# Patient Record
Sex: Male | Born: 1960
Health system: Southern US, Community
[De-identification: ages and names within clinical notes are randomized; demographics above are authoritative.]

## PROBLEM LIST (undated history)

## (undated) DIAGNOSIS — M549 Dorsalgia, unspecified: Secondary | ICD-10-CM

## (undated) DIAGNOSIS — G8929 Other chronic pain: Secondary | ICD-10-CM

## (undated) DIAGNOSIS — I96 Gangrene, not elsewhere classified: Secondary | ICD-10-CM

## (undated) DIAGNOSIS — IMO0002 Reserved for concepts with insufficient information to code with codable children: Secondary | ICD-10-CM

## (undated) DIAGNOSIS — F4310 Post-traumatic stress disorder, unspecified: Secondary | ICD-10-CM

## (undated) DIAGNOSIS — I10 Essential (primary) hypertension: Secondary | ICD-10-CM

## (undated) HISTORY — PX: ABSCESS DRAINAGE: SHX1119

## (undated) HISTORY — PX: OTHER SURGICAL HISTORY: SHX169

---
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
## 2002-05-05 ENCOUNTER — Emergency Department (HOSPITAL_COMMUNITY): Admission: EM | Admit: 2002-05-05 | Discharge: 2002-05-05 | Payer: Self-pay

## 2002-08-15 ENCOUNTER — Encounter: Payer: Self-pay | Admitting: *Deleted

## 2002-08-15 ENCOUNTER — Emergency Department (HOSPITAL_COMMUNITY): Admission: EM | Admit: 2002-08-15 | Discharge: 2002-08-15 | Payer: Self-pay | Admitting: Emergency Medicine

## 2002-08-18 ENCOUNTER — Emergency Department (HOSPITAL_COMMUNITY): Admission: EM | Admit: 2002-08-18 | Discharge: 2002-08-18 | Payer: Self-pay | Admitting: Emergency Medicine

## 2002-08-18 ENCOUNTER — Encounter: Payer: Self-pay | Admitting: Emergency Medicine

## 2002-08-22 ENCOUNTER — Emergency Department (HOSPITAL_COMMUNITY): Admission: EM | Admit: 2002-08-22 | Discharge: 2002-08-22 | Payer: Self-pay | Admitting: Emergency Medicine

## 2002-09-13 ENCOUNTER — Emergency Department (HOSPITAL_COMMUNITY): Admission: EM | Admit: 2002-09-13 | Discharge: 2002-09-13 | Payer: Self-pay | Admitting: Emergency Medicine

## 2002-09-13 ENCOUNTER — Encounter: Payer: Self-pay | Admitting: Emergency Medicine

## 2007-02-07 ENCOUNTER — Emergency Department (HOSPITAL_COMMUNITY): Admission: EM | Admit: 2007-02-07 | Discharge: 2007-02-07 | Payer: Self-pay | Admitting: Emergency Medicine

## 2008-12-23 ENCOUNTER — Emergency Department (HOSPITAL_COMMUNITY): Admission: EM | Admit: 2008-12-23 | Discharge: 2008-12-23 | Payer: Self-pay | Admitting: Emergency Medicine

## 2008-12-28 ENCOUNTER — Inpatient Hospital Stay (HOSPITAL_COMMUNITY): Admission: EM | Admit: 2008-12-28 | Discharge: 2008-12-30 | Payer: Self-pay | Admitting: Emergency Medicine

## 2009-01-15 ENCOUNTER — Emergency Department (HOSPITAL_COMMUNITY): Admission: EM | Admit: 2009-01-15 | Discharge: 2009-01-15 | Payer: Self-pay | Admitting: Emergency Medicine

## 2010-05-15 ENCOUNTER — Emergency Department (HOSPITAL_COMMUNITY)
Admission: EM | Admit: 2010-05-15 | Discharge: 2010-05-15 | Disposition: A | Discharge status: Home / Self Care | Payer: Medicare Other | Attending: Emergency Medicine | Admitting: Emergency Medicine

## 2010-05-15 DIAGNOSIS — M545 Low back pain, unspecified: Secondary | ICD-10-CM | POA: Insufficient documentation

## 2010-05-15 DIAGNOSIS — M549 Dorsalgia, unspecified: Secondary | ICD-10-CM | POA: Insufficient documentation

## 2010-05-18 LAB — DIFFERENTIAL
Eosinophils Relative: 1 % (ref 0–5)
Lymphocytes Relative: 19 % (ref 12–46)
Lymphs Abs: 2.5 10*3/uL (ref 0.7–4.0)
Monocytes Absolute: 1.4 10*3/uL — ABNORMAL HIGH (ref 0.1–1.0)
Neutro Abs: 9.4 10*3/uL — ABNORMAL HIGH (ref 1.7–7.7)

## 2010-05-18 LAB — CULTURE, ROUTINE-ABSCESS

## 2010-05-18 LAB — POCT I-STAT, CHEM 8
BUN: 8 mg/dL (ref 6–23)
Calcium, Ion: 1.13 mmol/L (ref 1.12–1.32)
Creatinine, Ser: 1.2 mg/dL (ref 0.4–1.5)
Hemoglobin: 17.7 g/dL — ABNORMAL HIGH (ref 13.0–17.0)
Sodium: 141 mEq/L (ref 135–145)
TCO2: 32 mmol/L (ref 0–100)

## 2010-05-18 LAB — ANAEROBIC CULTURE

## 2010-05-18 LAB — CBC
HCT: 48.2 % (ref 39.0–52.0)
Hemoglobin: 16.4 g/dL (ref 13.0–17.0)
RBC: 5.39 MIL/uL (ref 4.22–5.81)
WBC: 13.4 10*3/uL — ABNORMAL HIGH (ref 4.0–10.5)

## 2010-05-18 LAB — CULTURE, BLOOD (ROUTINE X 2)

## 2010-05-18 LAB — APTT: aPTT: 31 seconds (ref 24–37)

## 2010-12-31 ENCOUNTER — Encounter: Payer: Self-pay | Admitting: *Deleted

## 2010-12-31 ENCOUNTER — Emergency Department (INDEPENDENT_AMBULATORY_CARE_PROVIDER_SITE_OTHER)
Admission: EM | Admit: 2010-12-31 | Discharge: 2010-12-31 | Disposition: A | Discharge status: Home / Self Care | Payer: Medicare Other | Source: Home / Self Care | Attending: Family Medicine | Admitting: Family Medicine

## 2010-12-31 DIAGNOSIS — L03211 Cellulitis of face: Secondary | ICD-10-CM

## 2010-12-31 DIAGNOSIS — J019 Acute sinusitis, unspecified: Secondary | ICD-10-CM

## 2010-12-31 DIAGNOSIS — L0201 Cutaneous abscess of face: Secondary | ICD-10-CM

## 2010-12-31 MED ORDER — SULFAMETHOXAZOLE-TRIMETHOPRIM 800-160 MG PO TABS: 1.0000 | ORAL_TABLET | Freq: Two times a day (BID) | ORAL | Status: AC

## 2010-12-31 MED ORDER — MUPIROCIN CALCIUM 2 % EX CREA: TOPICAL_CREAM | Freq: Three times a day (TID) | CUTANEOUS | Status: AC

## 2010-12-31 MED ORDER — FEXOFENADINE-PSEUDOEPHED ER 180-240 MG PO TB24: 1.0000 | ORAL_TABLET | Freq: Every day | ORAL | Status: DC

## 2010-12-31 NOTE — ED Provider Notes (Signed)
History     CSN: [id number] Arrival date & time: 12/31/2010 10:53 AM   First MD Initiated Contact with Patient 12/31/10 1147      Chief Complaint  Patient presents with  . Cyst    (Consider location/radiation/quality/duration/timing/severity/associated sxs/prior treatment) Patient is a 50 y.o. male presenting with abscess. The history is provided by the patient.  Abscess  This is a new problem. The current episode started more than one week ago. The problem occurs continuously. The problem has been gradually worsening. The abscess is present on the face. The problem is moderate. The abscess is characterized by redness, painfulness, draining and swelling. Associated symptoms include congestion. There is nasal congestion. The congestion does not interfere with sleep. The congestion does not interfere with eating or drinking. The rhinorrhea has been occurring frequently. The nasal discharge has a yellow and green appearance. There were no sick contacts. He has received no recent medical care.   Patient reports multiple times opening the abscess. History reviewed. No pertinent past medical history.  Past Surgical History  Procedure Date  . Right thumb, finger     History reviewed. No pertinent family history.  History  Substance Use Topics  . Smoking status: Current Everyday Smoker  . Smokeless tobacco: Not on file  . Alcohol Use: Yes      Review of Systems  HENT: Positive for congestion.        Pressure over R nostril     Allergies  Review of patient's allergies indicates no known allergies.  Home Medications   Current Outpatient Rx  Name Route Sig Dispense Refill  . FEXOFENADINE-PSEUDOEPHEDRINE 180-240 MG PO TB24 Oral Take 1 tablet by mouth daily. 30 tablet 1  . MUPIROCIN CALCIUM 2 % EX CREA Topical Apply topically 3 (three) times daily. 15 g 0  . SULFAMETHOXAZOLE-TRIMETHOPRIM 800-160 MG PO TABS Oral Take 1 tablet by mouth 2 (two) times daily. 28 tablet 0    BP  144/92  Pulse 81  Temp(Src) 99.1 F (37.3 C) (Oral)  Resp 18  SpO2 98%  Physical Exam  Constitutional: He appears well-developed and well-nourished.  HENT:  Head: Normocephalic.    Right Ear: Hearing, tympanic membrane, external ear and ear canal normal.  Left Ear: Hearing, tympanic membrane, external ear and ear canal normal.  Nose: Mucosal edema, rhinorrhea and sinus tenderness present. Right sinus exhibits maxillary sinus tenderness.  Mouth/Throat: Mucous membranes are normal.       Multiple old drainage sites    ED Course  Procedures (including critical care time) Patient informed abscess is not ready to open because of its repeatedly drainage by him  1. Acute sinusitis   2. Abscess of face       MDM          Hassan Rowan, MD 12/31/10 2040

## 2010-12-31 NOTE — ED Notes (Signed)
Co cyst to right cheek area x 1 week, getting worse past few days

## 2011-03-24 ENCOUNTER — Emergency Department (HOSPITAL_COMMUNITY)
Admission: EM | Admit: 2011-03-24 | Discharge: 2011-03-24 | Disposition: A | Discharge status: Home / Self Care | Payer: Medicare Other | Attending: Emergency Medicine | Admitting: Emergency Medicine

## 2011-03-24 ENCOUNTER — Encounter (HOSPITAL_COMMUNITY): Payer: Self-pay

## 2011-03-24 DIAGNOSIS — M25571 Pain in right ankle and joints of right foot: Secondary | ICD-10-CM

## 2011-03-24 DIAGNOSIS — M19079 Primary osteoarthritis, unspecified ankle and foot: Secondary | ICD-10-CM

## 2011-03-24 DIAGNOSIS — M25579 Pain in unspecified ankle and joints of unspecified foot: Secondary | ICD-10-CM | POA: Insufficient documentation

## 2011-03-24 MED ORDER — TRAMADOL HCL 50 MG PO TABS: 50.0000 mg | ORAL_TABLET | Freq: Four times a day (QID) | ORAL | Status: AC | PRN

## 2011-03-24 NOTE — ED Provider Notes (Signed)
Medical screening examination/treatment/procedure(s) were performed by non-physician practitioner and as supervising physician I was immediately available for consultation/collaboration.  Flint Melter, MD 03/24/11 2149
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
## 2011-03-24 NOTE — ED Notes (Signed)
Rt, ankle pain denies any injury, no swelling or deformity noted

## 2011-07-05 ENCOUNTER — Emergency Department (HOSPITAL_COMMUNITY)
Admission: EM | Admit: 2011-07-05 | Discharge: 2011-07-05 | Disposition: A | Discharge status: Home / Self Care | Payer: Medicare Other | Attending: Emergency Medicine | Admitting: Emergency Medicine

## 2011-07-05 ENCOUNTER — Encounter (HOSPITAL_COMMUNITY): Payer: Self-pay | Admitting: Emergency Medicine

## 2011-07-05 DIAGNOSIS — IMO0002 Reserved for concepts with insufficient information to code with codable children: Secondary | ICD-10-CM | POA: Insufficient documentation

## 2011-07-05 DIAGNOSIS — F17200 Nicotine dependence, unspecified, uncomplicated: Secondary | ICD-10-CM | POA: Insufficient documentation

## 2011-07-05 DIAGNOSIS — L02419 Cutaneous abscess of limb, unspecified: Secondary | ICD-10-CM

## 2011-07-05 HISTORY — DX: Gangrene, not elsewhere classified: I96

## 2011-07-05 MED ORDER — CLINDAMYCIN HCL 300 MG PO CAPS
300.0000 mg | ORAL_CAPSULE | Freq: Once | ORAL | Status: AC
  Administered 2011-07-05: 300 mg via ORAL
  Filled 2011-07-05: qty 1

## 2011-07-05 MED ORDER — SULFAMETHOXAZOLE-TMP DS 800-160 MG PO TABS
1.0000 | ORAL_TABLET | Freq: Once | ORAL | Status: AC
  Administered 2011-07-05: 1 via ORAL
  Filled 2011-07-05: qty 1

## 2011-07-05 MED ORDER — CLINDAMYCIN HCL 150 MG PO CAPS: 150.0000 mg | ORAL_CAPSULE | Freq: Four times a day (QID) | ORAL | Status: AC

## 2011-07-05 MED ORDER — LIDOCAINE HCL 1 % IJ SOLN
2.0000 mL | Freq: Once | INTRAMUSCULAR | Status: AC
  Administered 2011-07-05: 2 mL

## 2011-07-05 MED ORDER — HYDROCODONE-ACETAMINOPHEN 5-325 MG PO TABS
1.0000 | ORAL_TABLET | Freq: Once | ORAL | Status: AC
  Administered 2011-07-05: 1 via ORAL
  Filled 2011-07-05: qty 1

## 2011-07-05 MED ORDER — SULFAMETHOXAZOLE-TMP DS 800-160 MG PO TABS: 1.0000 | ORAL_TABLET | Freq: Two times a day (BID) | ORAL | Status: AC

## 2011-07-05 MED ORDER — HYDROCODONE-ACETAMINOPHEN 5-325 MG PO TABS: 1.0000 | ORAL_TABLET | Freq: Four times a day (QID) | ORAL | Status: AC | PRN
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
## 2011-07-05 NOTE — ED Provider Notes (Signed)
Medical screening examination/treatment/procedure(s) were performed by non-physician practitioner and as supervising physician I was immediately available for consultation/collaboration.  Jaidalyn Schillo M Kamy Poinsett, MD 07/05/11 0714 

## 2011-07-05 NOTE — Discharge Instructions (Signed)
Abscess Care After An abscess (also called a boil or furuncle) is an infected area that contains a collection of pus. Signs and symptoms of an abscess include pain, tenderness, redness, or hardness, or you may feel a moveable soft area under your skin. An abscess can occur anywhere in the body. The infection may spread to surrounding tissues causing cellulitis. A cut (incision) by the surgeon was made over your abscess and the pus was drained out. Gauze may have been packed into the space to provide a drain that will allow the cavity to heal from the inside outwards. The boil may be painful for 5 to 7 days. Most people with a boil do not have high fevers. Your abscess, if seen early, may not have localized, and may not have been lanced. If not, another appointment may be required for this if it does not get better on its own or with medications. HOME CARE INSTRUCTIONS   Only take over-the-counter or prescription medicines for pain, discomfort, or fever as directed by your caregiver.   When you bathe, soak and then remove gauze or iodoform packs at least daily or as directed by your caregiver. You may then wash the wound gently with mild soapy water. Repack with gauze or do as your caregiver directs.  SEEK IMMEDIATE MEDICAL CARE IF:   You develop increased pain, swelling, redness, drainage, or bleeding in the wound site.   You develop signs of generalized infection including muscle aches, chills, fever, or a general ill feeling.   An oral temperature above 102 F (38.9 C) develops, not controlled by medication.  See your caregiver for a recheck if you develop any of the symptoms described above. If medications (antibiotics) were prescribed, take them as directed. Document Released: 08/18/2004 Document Revised: 01/19/2011 Document Reviewed: 04/15/2007 [facility] Patient Information 2012 [place], [place]. Take all the antibiotic until completion.  He can remove the packing in 2 days.  He will have  continued drainage until the skin has closed over.  Please keep the dressing in place until this occurs up with your doctors at the [place]

## 2011-07-05 NOTE — ED Notes (Signed)
Presents to ED with c/o "painful cysts under my left arm".  Noted 4 intact raised and swollen areas on left axilla. Pt reports that about a week ago, he popped out one abscess-- able to "squeezed pus out of it", pt further reports that after draining the abscessed area, 3 more abscesses came up and now a total of 4--verbalized that "it got infected and spreading". Pt denies having any fever or any other symptoms.

## 2011-07-23 ENCOUNTER — Encounter (HOSPITAL_COMMUNITY): Payer: Self-pay | Admitting: *Deleted

## 2011-07-23 ENCOUNTER — Emergency Department (HOSPITAL_COMMUNITY): Payer: Medicare Other

## 2011-07-23 ENCOUNTER — Emergency Department (HOSPITAL_COMMUNITY)
Admission: EM | Admit: 2011-07-23 | Discharge: 2011-07-23 | Payer: Medicare Other | Attending: Emergency Medicine | Admitting: Emergency Medicine

## 2011-07-23 ENCOUNTER — Encounter (HOSPITAL_COMMUNITY): Payer: Self-pay

## 2011-07-23 ENCOUNTER — Emergency Department (HOSPITAL_COMMUNITY)
Admission: EM | Admit: 2011-07-23 | Discharge: 2011-07-23 | Disposition: A | Discharge status: Home / Self Care | Payer: Medicare Other | Attending: Emergency Medicine | Admitting: Emergency Medicine

## 2011-07-23 DIAGNOSIS — IMO0002 Reserved for concepts with insufficient information to code with codable children: Secondary | ICD-10-CM | POA: Insufficient documentation

## 2011-07-23 DIAGNOSIS — T148XXA Other injury of unspecified body region, initial encounter: Secondary | ICD-10-CM

## 2011-07-23 DIAGNOSIS — R079 Chest pain, unspecified: Secondary | ICD-10-CM | POA: Insufficient documentation

## 2011-07-23 DIAGNOSIS — F329 Major depressive disorder, single episode, unspecified: Secondary | ICD-10-CM | POA: Insufficient documentation

## 2011-07-23 DIAGNOSIS — F4310 Post-traumatic stress disorder, unspecified: Secondary | ICD-10-CM | POA: Insufficient documentation

## 2011-07-23 DIAGNOSIS — M549 Dorsalgia, unspecified: Secondary | ICD-10-CM | POA: Insufficient documentation

## 2011-07-23 DIAGNOSIS — G8929 Other chronic pain: Secondary | ICD-10-CM | POA: Insufficient documentation

## 2011-07-23 DIAGNOSIS — F3289 Other specified depressive episodes: Secondary | ICD-10-CM | POA: Insufficient documentation

## 2011-07-23 HISTORY — DX: Post-traumatic stress disorder, unspecified: F43.10

## 2011-07-23 HISTORY — DX: Reserved for concepts with insufficient information to code with codable children: IMO0002

## 2011-07-23 HISTORY — DX: Essential (primary) hypertension: I10
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
## 2011-07-23 NOTE — ED Notes (Signed)
Patient given discharge instructions, information, prescriptions, and diet order. Patient states that they adequately understand discharge information given and to return to ED if symptoms return or worsen.     
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
## 2011-07-24 NOTE — ED Provider Notes (Signed)
Medical screening examination/treatment/procedure(s) were performed by non-physician practitioner and as supervising physician I was immediately available for consultation/collaboration.  Renai Lopata, MD 07/24/11 0030 

## 2012-03-21 ENCOUNTER — Encounter (HOSPITAL_COMMUNITY): Payer: Self-pay | Admitting: *Deleted

## 2012-03-21 ENCOUNTER — Emergency Department (HOSPITAL_COMMUNITY)
Admission: EM | Admit: 2012-03-21 | Discharge: 2012-03-21 | Disposition: A | Discharge status: Home / Self Care | Payer: No Typology Code available for payment source | Attending: Emergency Medicine | Admitting: Emergency Medicine

## 2012-03-21 ENCOUNTER — Emergency Department (HOSPITAL_COMMUNITY): Payer: No Typology Code available for payment source

## 2012-03-21 DIAGNOSIS — R0789 Other chest pain: Secondary | ICD-10-CM

## 2012-03-21 DIAGNOSIS — R071 Chest pain on breathing: Secondary | ICD-10-CM | POA: Insufficient documentation

## 2012-03-21 DIAGNOSIS — Z8659 Personal history of other mental and behavioral disorders: Secondary | ICD-10-CM | POA: Insufficient documentation

## 2012-03-21 DIAGNOSIS — Z79899 Other long term (current) drug therapy: Secondary | ICD-10-CM | POA: Insufficient documentation

## 2012-03-21 DIAGNOSIS — I10 Essential (primary) hypertension: Secondary | ICD-10-CM | POA: Insufficient documentation

## 2012-03-21 DIAGNOSIS — Z8739 Personal history of other diseases of the musculoskeletal system and connective tissue: Secondary | ICD-10-CM | POA: Insufficient documentation

## 2012-03-21 DIAGNOSIS — Z8619 Personal history of other infectious and parasitic diseases: Secondary | ICD-10-CM | POA: Insufficient documentation

## 2012-03-21 DIAGNOSIS — F17200 Nicotine dependence, unspecified, uncomplicated: Secondary | ICD-10-CM | POA: Insufficient documentation

## 2012-03-21 DIAGNOSIS — Y939 Activity, unspecified: Secondary | ICD-10-CM | POA: Insufficient documentation

## 2012-03-21 MED ORDER — OXYCODONE-ACETAMINOPHEN 5-325 MG PO TABS
1.0000 | ORAL_TABLET | Freq: Once | ORAL | Status: AC
  Administered 2012-03-21: 1 via ORAL
  Filled 2012-03-21: qty 1

## 2012-03-21 MED ORDER — OXYCODONE-ACETAMINOPHEN 5-325 MG PO TABS: 1.0000 | ORAL_TABLET | Freq: Four times a day (QID) | ORAL | Status: DC | PRN
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
## 2012-03-21 NOTE — ED Notes (Signed)
Patient transported to X-ray 

## 2012-03-21 NOTE — ED Notes (Signed)
Pt c/o of pain with movement in center of chest and SOB at times.

## 2012-04-02 ENCOUNTER — Emergency Department (HOSPITAL_COMMUNITY)
Admission: EM | Admit: 2012-04-02 | Discharge: 2012-04-02 | Disposition: A | Discharge status: Home / Self Care | Payer: Medicare Other | Attending: Emergency Medicine | Admitting: Emergency Medicine

## 2012-04-02 ENCOUNTER — Encounter (HOSPITAL_COMMUNITY): Payer: Self-pay | Admitting: Emergency Medicine

## 2012-04-02 DIAGNOSIS — F4310 Post-traumatic stress disorder, unspecified: Secondary | ICD-10-CM | POA: Insufficient documentation

## 2012-04-02 DIAGNOSIS — Z79899 Other long term (current) drug therapy: Secondary | ICD-10-CM | POA: Insufficient documentation

## 2012-04-02 DIAGNOSIS — IMO0002 Reserved for concepts with insufficient information to code with codable children: Secondary | ICD-10-CM | POA: Insufficient documentation

## 2012-04-02 DIAGNOSIS — Z8659 Personal history of other mental and behavioral disorders: Secondary | ICD-10-CM | POA: Insufficient documentation

## 2012-04-02 DIAGNOSIS — Z872 Personal history of diseases of the skin and subcutaneous tissue: Secondary | ICD-10-CM | POA: Insufficient documentation

## 2012-04-02 DIAGNOSIS — Z8739 Personal history of other diseases of the musculoskeletal system and connective tissue: Secondary | ICD-10-CM | POA: Insufficient documentation

## 2012-04-02 DIAGNOSIS — I10 Essential (primary) hypertension: Secondary | ICD-10-CM | POA: Insufficient documentation

## 2012-04-02 DIAGNOSIS — F17200 Nicotine dependence, unspecified, uncomplicated: Secondary | ICD-10-CM | POA: Insufficient documentation

## 2012-04-02 DIAGNOSIS — R451 Restlessness and agitation: Secondary | ICD-10-CM

## 2012-04-02 LAB — CBC
Hemoglobin: 13.7 g/dL (ref 13.0–17.0)
RBC: 4.75 MIL/uL (ref 4.22–5.81)

## 2012-04-02 LAB — RAPID URINE DRUG SCREEN, HOSP PERFORMED
Barbiturates: NOT DETECTED
Cocaine: NOT DETECTED
Tetrahydrocannabinol: POSITIVE — AB

## 2012-04-02 LAB — ACETAMINOPHEN LEVEL: Acetaminophen (Tylenol), Serum: <15 ug/mL (ref 10–30)

## 2012-04-02 LAB — COMPREHENSIVE METABOLIC PANEL
ALT: 16 U/L (ref 0–53)
Alkaline Phosphatase: 73 U/L (ref 39–117)
CO2: 23 mEq/L (ref 19–32)
Chloride: 107 mEq/L (ref 96–112)
GFR calc Af Amer: 86 mL/min — ABNORMAL LOW (ref >90)
GFR calc non Af Amer: 74 mL/min — ABNORMAL LOW (ref >90)
Glucose, Bld: 101 mg/dL — ABNORMAL HIGH (ref 70–99)
Potassium: 3.9 mEq/L (ref 3.5–5.1)
Sodium: 142 mEq/L (ref 135–145)
Total Bilirubin: 0.6 mg/dL (ref 0.3–1.2)

## 2012-04-02 MED ORDER — METHOCARBAMOL 500 MG PO TABS
750.0000 mg | ORAL_TABLET | Freq: Once | ORAL | Status: AC
  Administered 2012-04-02: 750 mg via ORAL
  Filled 2012-04-02: qty 2
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
## 2012-04-02 NOTE — ED Notes (Signed)
Patient changed into blue scrubs and red socks. Two bags of belongings placed behind triage nursing station. One bag has a gold watch and a small cup with one necklace, one ring, and one earring.

## 2012-04-02 NOTE — ED Notes (Signed)
telepsych papers faxed to [facility]. Awaiting confirmation.

## 2012-04-02 NOTE — ED Notes (Signed)
Patient anxious about being in the ED all day. Explained to patient that he is waiting on telepsych. Patient changed into blue scrubs. Patient and belongings bags x 3 wanded by security

## 2012-04-02 NOTE — ED Notes (Signed)
PA Nicole at bedside. 
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
## 2012-04-02 NOTE — ED Notes (Signed)
Consult for telepsych form faxed.  Telepsych specialist on call contacted.

## 2012-04-02 NOTE — ED Notes (Signed)
Pt is talking with Det to set up an appt with Det.

## 2012-04-02 NOTE — ED Notes (Signed)
Off. Earlene Plater talked with Det and has informed pt that the Det would not be coming to the hospital to see him.

## 2012-04-02 NOTE — ED Notes (Signed)
Telepsych physician-Dr. Berlin Hun called and gave a telephone order to discharge the patient.
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
## 2012-04-02 NOTE — ED Provider Notes (Signed)
Medical screening examination/treatment/procedure(s) were performed by non-physician practitioner and as supervising physician I was immediately available for consultation/collaboration.   Celene Kras, MD 04/02/12 [phone]

## 2013-01-26 ENCOUNTER — Emergency Department (HOSPITAL_COMMUNITY)
Admission: EM | Admit: 2013-01-26 | Discharge: 2013-01-26 | Disposition: A | Discharge status: Home / Self Care | Payer: Medicare Other | Attending: Emergency Medicine | Admitting: Emergency Medicine

## 2013-01-26 ENCOUNTER — Encounter (HOSPITAL_COMMUNITY): Payer: Self-pay | Admitting: Emergency Medicine

## 2013-01-26 DIAGNOSIS — Z8659 Personal history of other mental and behavioral disorders: Secondary | ICD-10-CM | POA: Insufficient documentation

## 2013-01-26 DIAGNOSIS — R52 Pain, unspecified: Secondary | ICD-10-CM | POA: Insufficient documentation

## 2013-01-26 DIAGNOSIS — M545 Low back pain, unspecified: Secondary | ICD-10-CM | POA: Insufficient documentation

## 2013-01-26 DIAGNOSIS — F17200 Nicotine dependence, unspecified, uncomplicated: Secondary | ICD-10-CM | POA: Insufficient documentation

## 2013-01-26 DIAGNOSIS — G8929 Other chronic pain: Secondary | ICD-10-CM

## 2013-01-26 DIAGNOSIS — I10 Essential (primary) hypertension: Secondary | ICD-10-CM | POA: Insufficient documentation

## 2013-01-26 DIAGNOSIS — Z79899 Other long term (current) drug therapy: Secondary | ICD-10-CM | POA: Insufficient documentation

## 2013-01-26 MED ORDER — MELOXICAM 15 MG PO TABS
15.0000 mg | ORAL_TABLET | Freq: Every day | ORAL | Status: DC
  Administered 2013-01-26: 15 mg via ORAL
  Filled 2013-01-26: qty 1

## 2013-01-26 MED ORDER — MELOXICAM 15 MG PO TABS: 15.0000 mg | ORAL_TABLET | Freq: Every day | ORAL | Status: DC

## 2013-01-26 MED ORDER — DIAZEPAM 5 MG PO TABS: 5.0000 mg | ORAL_TABLET | Freq: Two times a day (BID) | ORAL | Status: DC

## 2013-01-26 MED ORDER — DIAZEPAM 5 MG PO TABS
5.0000 mg | ORAL_TABLET | Freq: Once | ORAL | Status: AC
  Administered 2013-01-26: 5 mg via ORAL
  Filled 2013-01-26: qty 1

## 2013-01-26 NOTE — ED Provider Notes (Signed)
CSN: [id number]     Arrival date & time 01/26/13  1040 History   First MD Initiated Contact with Patient 01/26/13 1102     Chief Complaint  Patient presents with  . Back Pain   (Consider location/radiation/quality/duration/timing/severity/associated sxs/prior Treatment) HPI  52 year old male hx of DDD presents for low back pain. Patient reportedly has history of chronic back pain however for the past 2 days it has worsened.  sts pain started shortly he returned from a trip from [place]. Denies any heavy lifting or any significant trauma or injury during the trip. Describe pain as a sharp and aching sensation to his right low back occasionally radiates to his right thigh. Pain worsening with movement especially walking. Pain is currently 10 out of 10. Pain is similar to his regular pain except worse.  He has tried tylenol, robaxin, lidocaine cream with some relief.  No complaints of fever, chills, urinary or bowel incontinence, or saddle paresthesia, or rash. No history of IV drug use cancer. Denies any dysuria, hematuria, abdominal pain or testicular pain. Able to ambulate.  Past Medical History  Diagnosis Date  . Gangrene of digit 1975    right thumb gangrene  . Hypertension   . Degenerated intervertebral disc   . PTSD (post-traumatic stress disorder)    Past Surgical History  Procedure Laterality Date  . Right thumb, finger    . Abscess drainage     No family history on file. History  Substance Use Topics  . Smoking status: Current Some Day Smoker  . Smokeless tobacco: Not on file  . Alcohol Use: No     Comment: occasionally    Review of Systems  Constitutional: Negative for fever.  Musculoskeletal: Positive for back pain.  Skin: Negative for rash and wound.  Neurological: Negative for numbness.    Allergies  Review of patient's allergies indicates no known allergies.  Home Medications   Current Outpatient Rx  Name  Route  Sig  Dispense  Refill  .  acetaminophen (TYLENOL) 500 MG tablet   Oral   Take 1,000 mg by mouth every 6 (six) hours as needed.         . lidocaine (LMX) 4 % cream   Topical   Apply 1 application topically 2 (two) times daily as needed (pain).         . methocarbamol (ROBAXIN) 500 MG tablet   Oral   Take 500 mg by mouth 2 (two) times daily.          BP 123/85  Pulse 79  Temp(Src) 98.6 F (37 C) (Oral)  Resp 18  SpO2 100% Physical Exam  Constitutional: He appears well-developed and well-nourished. No distress.  HENT:  Head: Atraumatic.  Eyes: Conjunctivae are normal.  Neck: Normal range of motion. Neck supple.  Abdominal: There is no tenderness.  Genitourinary:  No CVA tenderness.  Musculoskeletal: He exhibits tenderness (Right paralumbar tenderness on palpation without any significant midline spine tenderness, crepitus, or step-off noted. Pain with right hip flexion but negative straight leg raise.).  Neurological: He is alert.  Patellar deep tendon reflex intact bilaterally, no foot drop, able to ambulate.  Skin: No rash noted.  Psychiatric: He has a normal mood and affect.    ED Course  Procedures (including critical care time)  No red flag s/s of low back pain. Patient was counseled on back pain precautions and told to do activity as tolerated but do not lift, push, or pull heavy objects more than 10 pounds  for the next week. Patient counseled to use ice or heat on back for no longer than 15 minutes every hour.   Patient prescribed muscle relaxer and counseled on proper use of muscle relaxant medication.  Patient prescribed mobic and RICE therapy  Patient urged to follow-up with PCP if pain does not improve with treatment and rest or if pain becomes recurrent. Urged to return with worsening severe pain, loss of bowel or bladder control, trouble walking.  The patient verbalizes understanding and agrees with the plan.   Labs Review Labs Reviewed - No data to display Imaging Review No  results found.  EKG Interpretation   None       MDM   1. Acute exacerbation of chronic low back pain    BP 123/85  Pulse 79  Temp(Src) 98.6 F (37 C) (Oral)  Resp 18  SpO2 100%     Fayrene Helper, PA-C 01/26/13 1222

## 2013-01-26 NOTE — ED Notes (Signed)
Pt states that he has hx of back pain.  Worsening over the past 2 days during a trip to [facility].  Denies recent injury.

## 2013-01-28 ENCOUNTER — Emergency Department (HOSPITAL_COMMUNITY): Payer: Medicare Other

## 2013-01-28 ENCOUNTER — Emergency Department (HOSPITAL_COMMUNITY)
Admission: EM | Admit: 2013-01-28 | Discharge: 2013-01-28 | Disposition: A | Discharge status: Home / Self Care | Payer: Medicare Other | Attending: Emergency Medicine | Admitting: Emergency Medicine

## 2013-01-28 ENCOUNTER — Encounter (HOSPITAL_COMMUNITY): Payer: Self-pay | Admitting: Emergency Medicine

## 2013-01-28 DIAGNOSIS — Z791 Long term (current) use of non-steroidal anti-inflammatories (NSAID): Secondary | ICD-10-CM | POA: Insufficient documentation

## 2013-01-28 DIAGNOSIS — F4310 Post-traumatic stress disorder, unspecified: Secondary | ICD-10-CM | POA: Insufficient documentation

## 2013-01-28 DIAGNOSIS — G8929 Other chronic pain: Secondary | ICD-10-CM | POA: Insufficient documentation

## 2013-01-28 DIAGNOSIS — F17200 Nicotine dependence, unspecified, uncomplicated: Secondary | ICD-10-CM | POA: Insufficient documentation

## 2013-01-28 DIAGNOSIS — I10 Essential (primary) hypertension: Secondary | ICD-10-CM | POA: Insufficient documentation

## 2013-01-28 DIAGNOSIS — M545 Low back pain, unspecified: Secondary | ICD-10-CM | POA: Insufficient documentation

## 2013-01-28 DIAGNOSIS — R209 Unspecified disturbances of skin sensation: Secondary | ICD-10-CM | POA: Insufficient documentation

## 2013-01-28 DIAGNOSIS — M549 Dorsalgia, unspecified: Secondary | ICD-10-CM

## 2013-01-28 DIAGNOSIS — Z79899 Other long term (current) drug therapy: Secondary | ICD-10-CM | POA: Insufficient documentation

## 2013-01-28 MED ORDER — DIAZEPAM 5 MG PO TABS
5.0000 mg | ORAL_TABLET | Freq: Two times a day (BID) | ORAL | Status: DC
Start: 2013-01-28 — End: 2014-01-02

## 2013-01-28 MED ORDER — HYDROCODONE-ACETAMINOPHEN 5-325 MG PO TABS: 2.0000 | ORAL_TABLET | ORAL | Status: DC | PRN

## 2013-01-28 MED ORDER — KETOROLAC TROMETHAMINE 60 MG/2ML IM SOLN
60.0000 mg | Freq: Once | INTRAMUSCULAR | Status: AC
  Administered 2013-01-28: 60 mg via INTRAMUSCULAR
  Filled 2013-01-28: qty 2

## 2013-01-28 MED ORDER — DIAZEPAM 5 MG PO TABS
5.0000 mg | ORAL_TABLET | Freq: Once | ORAL | Status: AC
  Administered 2013-01-28: 5 mg via ORAL
  Filled 2013-01-28: qty 1

## 2013-01-28 MED ORDER — DIAZEPAM 5 MG PO TABS: 5.0000 mg | ORAL_TABLET | Freq: Two times a day (BID) | ORAL | Status: DC

## 2013-01-28 NOTE — ED Notes (Signed)
Per [facility]: Pt reports right sided lower back pain that radiates down the right leg. Pt denies injury or trauma. Pt was seen on 01/26/13 for same. Pt states he has a history of back spasms. Pt is ambulatory, vital signs WDL, and pt is in NAD.

## 2013-01-28 NOTE — ED Provider Notes (Signed)
CSN: [id number]     Arrival date & time 01/28/13  1304 History  This chart was scribed for non-physician practitioner Irish Elders, NP working with Toy Baker, MD by Danella Maiers, ED Scribe. This patient was seen in room WTR5/WTR5 and the patient's care was started at 3:12 PM.   Chief Complaint  Patient presents with  . Back Pain   The history is provided by the patient. No language interpreter was used.   HPI Comments: Nathaniel Griffin is a 52 y.o. male with a h/o degenerated intervertebral disc and chronic back pain brought in by ambulance who presents to the Emergency Department complaining of exacerbation of chronic right lower back pain into the top of his right buttock that radiates down the right leg. Pt was seen 2 days ago for the same. He reports intermittent episodes of numbness in his right thigh two days ago. He reports his back pain flares up every 4 months, the last episode was in August. He states normally he walks it off and takes pain medication but this time he is not getting relief. Pt denies injury or trauma. He denies urinary or bowel incontinence. He sees Dr Ophelia Charter in Orthopaedics next week.    Past Medical History  Diagnosis Date  . Gangrene of digit 1975    right thumb gangrene  . Hypertension   . Degenerated intervertebral disc   . PTSD (post-traumatic stress disorder)    Past Surgical History  Procedure Laterality Date  . Right thumb, finger    . Abscess drainage     No family history on file. History  Substance Use Topics  . Smoking status: Current Some Day Smoker  . Smokeless tobacco: Not on file  . Alcohol Use: No     Comment: occasionally    Review of Systems  Musculoskeletal: Positive for back pain.  Neurological: Positive for numbness.  All other systems reviewed and are negative.    Allergies  Review of patient's allergies indicates no known allergies.  Home Medications   Current Outpatient Rx  Name  Route  Sig  Dispense  Refill   . acetaminophen (TYLENOL) 500 MG tablet   Oral   Take 1,000 mg by mouth every 6 (six) hours as needed.         . diazepam (VALIUM) 5 MG tablet   Oral   Take 1 tablet (5 mg total) by mouth 2 (two) times daily.   10 tablet   0   . lidocaine (LMX) 4 % cream   Topical   Apply 1 application topically 2 (two) times daily as needed (pain).         . meloxicam (MOBIC) 15 MG tablet   Oral   Take 1 tablet (15 mg total) by mouth daily.   20 tablet   0   . methocarbamol (ROBAXIN) 500 MG tablet   Oral   Take 500 mg by mouth 2 (two) times daily.          BP 134/86  Pulse 90  Temp(Src) 97.8 F (36.6 C) (Oral)  Resp 14  SpO2 97% Physical Exam  Nursing note and vitals reviewed. Constitutional: He is oriented to person, place, and time. He appears well-developed and well-nourished. No distress.  HENT:  Head: Normocephalic and atraumatic.  Eyes: EOM are normal.  Neck: Neck supple. No tracheal deviation present.  Cardiovascular: Normal rate.   Pulmonary/Chest: Effort normal. No respiratory distress.  Musculoskeletal: Normal range of motion.  paravertebral tenderness on the right  extending down into his right buttock  Neurological: He is alert and oriented to person, place, and time.  Skin: Skin is warm and dry.  Psychiatric: He has a normal mood and affect. His behavior is normal.    ED Course  Procedures (including critical care time) Medications - No data to display  DIAGNOSTIC STUDIES: Oxygen Saturation is 97% on RA, normal by my interpretation.    COORDINATION OF CARE: 3:31 PM- Discussed treatment plan with pt which includes l-spine x-ray, Toradol, and Valium. Pt agrees to plan.  Labs Review Labs Reviewed - No data to display Imaging Review Dg Lumbar Spine Complete  01/28/2013   CLINICAL DATA:  Right-sided lower back pain.  EXAM: LUMBAR SPINE - COMPLETE 4+ VIEW  COMPARISON:  None.  FINDINGS: There are 5 nonrib bearing lumbar-type vertebral bodies. The vertebral  body heights are maintained. There is no spondylolysis. There is no acute fracture. There is severe degenerative disc disease at L5-S1. There is grade 1 anterolisthesis of L5 on S1 secondary to bilateral L5 pars interarticularis defects.  The SI joints are unremarkable.  IMPRESSION: No acute osseous injury of the lumbar spine.  Grade 1 anterolisthesis of L5 on S1 with bilateral L5 pars defects. Severe degenerative disc disease at L5-S1.   Electronically Signed   By: Elige Ko   On: 01/28/2013 15:54    EKG Interpretation   None       MDM   1. Back pain    Reports some relief after toradol and valium here. No acute injury on Lspine film. No focal weakness or deficits. No numbness, tingling or saddle anesthesia. Valium and norco prescriptions. Follow-up with Ortho appt scheduled next week.   I personally performed the services described in this documentation, which was scribed in my presence. The recorded information has been reviewed and is accurate.    Irish Elders, NP 02/01/13 [phone]

## 2013-01-30 NOTE — ED Provider Notes (Signed)
Medical screening examination/treatment/procedure(s) were performed by non-physician practitioner and as supervising physician I was immediately available for consultation/collaboration.  EKG Interpretation   None         Dina Mobley Y. Britaney Espaillat, MD 01/30/13 1310 

## 2013-02-01 NOTE — ED Provider Notes (Signed)
Medical screening examination/treatment/procedure(s) were performed by non-physician practitioner and as supervising physician I was immediately available for consultation/collaboration.  Toy Baker, MD 02/01/13 1630

## 2014-01-02 ENCOUNTER — Emergency Department (HOSPITAL_COMMUNITY)
Admission: EM | Admit: 2014-01-02 | Discharge: 2014-01-02 | Disposition: A | Discharge status: Home / Self Care | Payer: Medicare HMO | Attending: Emergency Medicine | Admitting: Emergency Medicine

## 2014-01-02 ENCOUNTER — Encounter (HOSPITAL_COMMUNITY): Payer: Self-pay

## 2014-01-02 DIAGNOSIS — Z720 Tobacco use: Secondary | ICD-10-CM | POA: Insufficient documentation

## 2014-01-02 DIAGNOSIS — Z791 Long term (current) use of non-steroidal anti-inflammatories (NSAID): Secondary | ICD-10-CM | POA: Diagnosis not present

## 2014-01-02 DIAGNOSIS — L0201 Cutaneous abscess of face: Secondary | ICD-10-CM | POA: Diagnosis not present

## 2014-01-02 DIAGNOSIS — Z79899 Other long term (current) drug therapy: Secondary | ICD-10-CM | POA: Insufficient documentation

## 2014-01-02 DIAGNOSIS — I10 Essential (primary) hypertension: Secondary | ICD-10-CM | POA: Insufficient documentation

## 2014-01-02 DIAGNOSIS — Z8659 Personal history of other mental and behavioral disorders: Secondary | ICD-10-CM | POA: Diagnosis not present

## 2014-01-02 DIAGNOSIS — R600 Localized edema: Secondary | ICD-10-CM | POA: Diagnosis present

## 2014-01-02 MED ORDER — LIDOCAINE-EPINEPHRINE 2 %-1:100000 IJ SOLN
10.0000 mL | Freq: Once | INTRAMUSCULAR | Status: AC
  Administered 2014-01-02: 10 mL
  Filled 2014-01-02: qty 1

## 2014-01-02 MED ORDER — SULFAMETHOXAZOLE-TRIMETHOPRIM 800-160 MG PO TABS: 1.0000 | ORAL_TABLET | Freq: Two times a day (BID) | ORAL | Status: DC

## 2014-01-02 NOTE — ED Notes (Signed)
PA at bedside.

## 2014-01-02 NOTE — Discharge Instructions (Signed)
Please read and follow all provided instructions.  Your diagnoses today include:  1. Facial abscess     Tests performed today include:  Vital signs. See below for your results today.   Medications prescribed:   Bactrim (trimethoprim/sulfamethoxazole) - antibiotic  You have been prescribed an antibiotic medicine: take the entire course of medicine even if you are feeling better. Stopping early can cause the antibiotic not to work.  Take any prescribed medications only as directed.   Home care instructions:   Follow any educational materials contained in this packet  Follow-up instructions: Take the packing out at home in 48 hours. Return to the Emergency Department for a recheck if your symptoms are not significantly improved.   Please follow-up with your primary care provider in the next 1 week for further evaluation of your symptoms.   Return instructions:  Return to the Emergency Department if you have:  Fever  Worsening symptoms  Worsening pain  Worsening swelling  Redness of the skin that moves away from the affected area, especially if it streaks away from the affected area   Any other emergent concerns  Your vital signs today were: BP 130/88 mmHg   Pulse 66   Temp(Src) 97.6 F (36.4 C) (Oral)   Resp 18   SpO2 100% If your blood pressure (BP) was elevated above 135/85 this visit, please have this repeated by your doctor within one month. --------------

## 2014-01-02 NOTE — ED Notes (Signed)
Pt started having swelling to left face on Sunday.  Today face is swollen with lump noted.  Painful to touch and is burning.

## 2014-01-02 NOTE — ED Notes (Signed)
Pt sts that he has had swelling and burning to L side of jaw x5 days. Pt denies tooth pain or mouth issues. Pt sts that he has hx of frequent skin infections and thinks that this may be an "ingrown hair". Pt is A&O and in NAD

## 2014-01-02 NOTE — ED Provider Notes (Signed)
CSN: [id number]     Arrival date & time 01/02/14  [id number] History   First MD Initiated Contact with Patient 01/02/14 [phone]     Chief Complaint  Patient presents with  . Facial Swelling     (Consider location/radiation/quality/duration/timing/severity/associated sxs/prior Treatment) HPI Comments: Patient with history of abscess presents with complaint of 5 days of left facial swelling. Patient noted a small amount of pus draining at onset. He is using warm compress without relief. He states that the area has gradually become worse; it is becoming more tender and swollen. No other treatments prior to arrival. No neck swelling or trouble swallowing. No dental problems. No fever, nausea or vomiting.  The history is provided by the patient.    Past Medical History  Diagnosis Date  . Gangrene of digit 1975    right thumb gangrene  . Hypertension   . Degenerated intervertebral disc   . PTSD (post-traumatic stress disorder)    Past Surgical History  Procedure Laterality Date  . Right thumb, finger    . Abscess drainage     History reviewed. No pertinent family history. History  Substance Use Topics  . Smoking status: Current Some Day Smoker  . Smokeless tobacco: Not on file  . Alcohol Use: No     Comment: occasionally    Review of Systems  Constitutional: Negative for fever.  Gastrointestinal: Negative for nausea and vomiting.  Skin: Negative for color change.       Positive for abscess  Hematological: Negative for adenopathy.      Allergies  Review of patient's allergies indicates no known allergies.  Home Medications   Prior to Admission medications   Medication Sig Start Date End Date Taking? Authorizing Provider  acetaminophen (TYLENOL) 500 MG tablet Take 1,000 mg by mouth every 6 (six) hours as needed (pain).     Historical Provider, MD  diazepam (VALIUM) 5 MG tablet Take 1 tablet (5 mg total) by mouth 2 (two) times daily. 01/26/13   Fayrene HelperBowie Tran, PA-C  diazepam  (VALIUM) 5 MG tablet Take 1 tablet (5 mg total) by mouth 2 (two) times daily. 01/28/13   Irish EldersKelly Walker, NP  diazepam (VALIUM) 5 MG tablet Take 1 tablet (5 mg total) by mouth 2 (two) times daily. 01/28/13   Irish EldersKelly Walker, NP  HYDROcodone-acetaminophen (NORCO/VICODIN) 5-325 MG per tablet Take 2 tablets by mouth every 4 (four) hours as needed. 01/28/13   Irish EldersKelly Walker, NP  HYDROcodone-acetaminophen (NORCO/VICODIN) 5-325 MG per tablet Take 2 tablets by mouth every 4 (four) hours as needed. 01/28/13   Irish EldersKelly Walker, NP  lidocaine (LMX) 4 % cream Apply 1 application topically 2 (two) times daily as needed (pain).    Historical Provider, MD  meloxicam (MOBIC) 15 MG tablet Take 1 tablet (15 mg total) by mouth daily. 01/26/13   Fayrene HelperBowie Tran, PA-C  methocarbamol (ROBAXIN) 500 MG tablet Take 500 mg by mouth 2 (two) times daily.    Historical Provider, MD   BP 130/88 mmHg  Pulse 66  Temp(Src) 97.6 F (36.4 C) (Oral)  Resp 18  SpO2 100%   Physical Exam  Constitutional: He appears well-developed and well-nourished.  HENT:  Head: Normocephalic and atraumatic.  L facial swelling consistent with abscess. Induration is 2-3cm in diameter. 1cm diameter fluid collection identified on US.   Eyes: Conjunctivae are normal.  Neck: Normal range of motion. Neck supple.  Pulmonary/Chest: No respiratory distress.  Neurological: He is alert.  Skin: Skin is warm and dry.  Psychiatric: He has  a normal mood and affect.  Nursing note and vitals reviewed.   ED Course  Procedures (including critical care time) Labs Review Labs Reviewed - No data to display  Imaging Review No results found.   EKG Interpretation None      9:16 AM Patient seen and examined. Medications ordered. Discussed plan to proceed with patient including abx, I&D, needle aspiration. He does not want us to cut his face with a scalpel. He is okay with needle aspiration. Will likely also place on antibiotics given location and needle aspiration only.    Vital signs reviewed and are as follows: BP 130/88 mmHg  Pulse 66  Temp(Src) 97.6 F (36.4 C) (Oral)  Resp 18  SpO2 100%  EMERGENCY DEPARTMENT US SOFT TISSUE INTERPRETATION "Study: Limited Ultrasound of the noted body part in comments below"  INDICATIONS: Soft tissue infection Multiple views of the body part are obtained with a multi-frequency linear probe  PERFORMED BY:  Myself  IMAGES ARCHIVED?: Yes  SIDE:Left  BODY PART:Other soft tisse (comment in note), face  FINDINGS: Abcess present  LIMITATIONS:  none  INTERPRETATION:  Abcess present  COMMENT: there appears to be some loculations inferiorly.   9:59 AM   INCISION AND DRAINAGE Performed by: Carolee RotaGEIPLE,Rasha Ibe S Consent: Verbal consent obtained. Risks and benefits: risks, benefits and alternatives were discussed Type: abscess  Body area: L face  Anesthesia: local infiltration  Aspiration was performed with an 18g needle  Local anesthetic: lidocaine 2% with epinephrine  Anesthetic total: 4 ml  Complexity: complex  Drainage: purulent  Drainage amount: 1.5cc purulent drainage  Packing material: none  Patient tolerance: Patient tolerated the procedure well with no immediate complications.   The patient was urged to return to the Emergency Department urgently with worsening pain, swelling, expanding erythema especially if it streaks away from the affected area, fever, or if they have any other concerns.   The patient was urged to return to the Emergency Department or go to their PCP in 48 hours for wound recheck if the area is not significantly improved.  Discussed that he should take complete course of antibiotics as prescribed and do not stop until improved.   The patient verbalized understanding and stated agreement with this plan.     MDM   Final diagnoses:  Facial abscess   Patient with skin abscess amenable to needle aspiration. Good amount of drainage removed with subjective improvement  in symptoms. Given that this was needle aspiration only, abx prescribed in addition to drainage. Patient counseled on s/s to return.   No dangerous or life-threatening conditions suspected or identified by history, physical exam, and by work-up. No indications for hospitalization identified.      Renne CriglerJoshua Devona Holmes, PA-C 01/02/14 1003  Derwood KaplanAnkit Nanavati, MD 01/02/14 1704

## 2015-10-13 ENCOUNTER — Emergency Department (HOSPITAL_COMMUNITY): Payer: Medicare Other

## 2015-10-13 ENCOUNTER — Emergency Department (HOSPITAL_COMMUNITY)
Admission: EM | Admit: 2015-10-13 | Discharge: 2015-10-13 | Disposition: A | Discharge status: Home / Self Care | Payer: Medicare Other | Attending: Emergency Medicine | Admitting: Emergency Medicine

## 2015-10-13 ENCOUNTER — Encounter (HOSPITAL_COMMUNITY): Payer: Self-pay

## 2015-10-13 DIAGNOSIS — Y939 Activity, unspecified: Secondary | ICD-10-CM | POA: Insufficient documentation

## 2015-10-13 DIAGNOSIS — I10 Essential (primary) hypertension: Secondary | ICD-10-CM | POA: Diagnosis not present

## 2015-10-13 DIAGNOSIS — M545 Low back pain, unspecified: Secondary | ICD-10-CM

## 2015-10-13 DIAGNOSIS — S3992XA Unspecified injury of lower back, initial encounter: Secondary | ICD-10-CM | POA: Diagnosis not present

## 2015-10-13 DIAGNOSIS — Y92410 Unspecified street and highway as the place of occurrence of the external cause: Secondary | ICD-10-CM | POA: Diagnosis not present

## 2015-10-13 DIAGNOSIS — Y999 Unspecified external cause status: Secondary | ICD-10-CM | POA: Insufficient documentation

## 2015-10-13 DIAGNOSIS — F17200 Nicotine dependence, unspecified, uncomplicated: Secondary | ICD-10-CM | POA: Insufficient documentation

## 2015-10-13 HISTORY — DX: Other chronic pain: G89.29

## 2015-10-13 HISTORY — DX: Dorsalgia, unspecified: M54.9

## 2015-10-13 MED ORDER — METHOCARBAMOL 500 MG PO TABS
500.0000 mg | ORAL_TABLET | Freq: Once | ORAL | Status: AC
  Administered 2015-10-13: 500 mg via ORAL
  Filled 2015-10-13: qty 1

## 2015-10-13 MED ORDER — OXYCODONE HCL 5 MG PO TABS
10.0000 mg | ORAL_TABLET | Freq: Once | ORAL | Status: AC
  Administered 2015-10-13: 10 mg via ORAL
  Filled 2015-10-13: qty 2

## 2015-10-13 MED ORDER — METHOCARBAMOL 500 MG PO TABS: 500.0000 mg | ORAL_TABLET | Freq: Two times a day (BID) | ORAL | 0 refills | Status: DC

## 2015-10-13 MED ORDER — IBUPROFEN 800 MG PO TABS: 800.0000 mg | ORAL_TABLET | Freq: Three times a day (TID) | ORAL | 0 refills | Status: DC
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
## 2015-10-13 NOTE — Discharge Instructions (Signed)

## 2018-01-27 DIAGNOSIS — S0990XA Unspecified injury of head, initial encounter: Secondary | ICD-10-CM | POA: Diagnosis not present

## 2018-01-27 DIAGNOSIS — T7411XA Adult physical abuse, confirmed, initial encounter: Secondary | ICD-10-CM | POA: Diagnosis not present

## 2018-01-27 DIAGNOSIS — R51 Headache: Secondary | ICD-10-CM | POA: Diagnosis not present

## 2018-01-27 DIAGNOSIS — R6889 Other general symptoms and signs: Secondary | ICD-10-CM | POA: Diagnosis not present

## 2018-01-27 DIAGNOSIS — F410 Panic disorder [episodic paroxysmal anxiety] without agoraphobia: Secondary | ICD-10-CM | POA: Diagnosis not present

## 2018-01-29 ENCOUNTER — Other Ambulatory Visit: Payer: Self-pay

## 2018-01-29 ENCOUNTER — Emergency Department (HOSPITAL_COMMUNITY)
Admission: EM | Admit: 2018-01-29 | Discharge: 2018-01-29 | Disposition: A | Discharge status: Home / Self Care | Payer: Medicare Other | Attending: Emergency Medicine | Admitting: Emergency Medicine

## 2018-01-29 ENCOUNTER — Encounter (HOSPITAL_COMMUNITY): Payer: Self-pay

## 2018-01-29 DIAGNOSIS — F17200 Nicotine dependence, unspecified, uncomplicated: Secondary | ICD-10-CM | POA: Insufficient documentation

## 2018-01-29 DIAGNOSIS — M791 Myalgia, unspecified site: Secondary | ICD-10-CM | POA: Diagnosis not present

## 2018-01-29 DIAGNOSIS — I10 Essential (primary) hypertension: Secondary | ICD-10-CM | POA: Insufficient documentation

## 2018-01-29 DIAGNOSIS — Z760 Encounter for issue of repeat prescription: Secondary | ICD-10-CM | POA: Insufficient documentation

## 2018-01-29 DIAGNOSIS — M79641 Pain in right hand: Secondary | ICD-10-CM | POA: Insufficient documentation

## 2018-01-29 MED ORDER — HYDROCODONE-ACETAMINOPHEN 5-325 MG PO TABS: 1.0000 | ORAL_TABLET | Freq: Four times a day (QID) | ORAL | 0 refills | Status: DC | PRN

## 2018-01-29 NOTE — ED Triage Notes (Signed)
Pt here from home with recent hand fracture out of prescription ibuprofen and percocet. States took last of medicine this am and is in pain, would like refill on these medications. PA in room to assess and treat.

## 2018-01-29 NOTE — Discharge Instructions (Signed)
Keep your appointment with the orthopedic doctor.   Ibuprofen as needed for mild to moderate pain.  Norco only as needed for severe pain. No further refills of this medication will be able to be given through the Emergency Department. You will have to go see your primary care doctor or the orthopedist for any further refills.   Return to ER for new or worsening symptoms, any additional concerns.
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
## 2018-01-30 ENCOUNTER — Emergency Department (HOSPITAL_COMMUNITY)
Admission: EM | Admit: 2018-01-30 | Discharge: 2018-01-30 | Disposition: A | Discharge status: Home / Self Care | Payer: Medicare Other | Attending: Emergency Medicine | Admitting: Emergency Medicine

## 2018-01-30 ENCOUNTER — Encounter (HOSPITAL_COMMUNITY): Payer: Self-pay | Admitting: *Deleted

## 2018-01-30 DIAGNOSIS — F1290 Cannabis use, unspecified, uncomplicated: Secondary | ICD-10-CM | POA: Diagnosis not present

## 2018-01-30 DIAGNOSIS — H9202 Otalgia, left ear: Secondary | ICD-10-CM | POA: Diagnosis present

## 2018-01-30 DIAGNOSIS — H6092 Unspecified otitis externa, left ear: Secondary | ICD-10-CM | POA: Diagnosis not present

## 2018-01-30 DIAGNOSIS — H60502 Unspecified acute noninfective otitis externa, left ear: Secondary | ICD-10-CM | POA: Insufficient documentation

## 2018-01-30 DIAGNOSIS — F17210 Nicotine dependence, cigarettes, uncomplicated: Secondary | ICD-10-CM | POA: Diagnosis not present

## 2018-01-30 MED ORDER — NEOMYCIN-COLIST-HC-THONZONIUM 3.3-3-10-0.5 MG/ML OT SUSP
3.0000 [drp] | Freq: Once | OTIC | Status: DC
  Filled 2018-01-30: qty 10

## 2018-01-30 MED ORDER — NEOMYCIN-POLYMYXIN-HC 3.5-10000-1 OT SUSP: 3.0000 [drp] | Freq: Four times a day (QID) | OTIC | 0 refills | Status: DC
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
## 2018-03-26 ENCOUNTER — Emergency Department (HOSPITAL_COMMUNITY)
Admission: EM | Admit: 2018-03-26 | Discharge: 2018-03-26 | Disposition: A | Discharge status: Home / Self Care | Payer: Medicare Other | Attending: Emergency Medicine | Admitting: Emergency Medicine

## 2018-03-26 ENCOUNTER — Encounter (HOSPITAL_COMMUNITY): Payer: Self-pay | Admitting: Emergency Medicine

## 2018-03-26 DIAGNOSIS — M79641 Pain in right hand: Secondary | ICD-10-CM | POA: Diagnosis not present

## 2018-03-26 DIAGNOSIS — Z5321 Procedure and treatment not carried out due to patient leaving prior to being seen by health care provider: Secondary | ICD-10-CM | POA: Insufficient documentation
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
## 2018-04-04 ENCOUNTER — Emergency Department (HOSPITAL_COMMUNITY): Payer: Medicare Other

## 2018-04-04 ENCOUNTER — Other Ambulatory Visit: Payer: Self-pay

## 2018-04-04 ENCOUNTER — Encounter (HOSPITAL_COMMUNITY): Payer: Self-pay

## 2018-04-04 ENCOUNTER — Encounter (HOSPITAL_COMMUNITY): Payer: Self-pay | Admitting: Emergency Medicine

## 2018-04-04 ENCOUNTER — Emergency Department (HOSPITAL_COMMUNITY)
Admission: EM | Admit: 2018-04-04 | Discharge: 2018-04-04 | Disposition: A | Discharge status: Other Acute Inpatient Hospital | Payer: Medicare Other | Attending: Emergency Medicine | Admitting: Emergency Medicine

## 2018-04-04 ENCOUNTER — Inpatient Hospital Stay (HOSPITAL_COMMUNITY)
Admission: AD | Admit: 2018-04-04 | Discharge: 2018-04-09 | DRG: 885 | Disposition: A | Discharge status: Home / Self Care | Payer: Medicare Other | Attending: Psychiatry | Admitting: Psychiatry

## 2018-04-04 DIAGNOSIS — I10 Essential (primary) hypertension: Secondary | ICD-10-CM | POA: Diagnosis present

## 2018-04-04 DIAGNOSIS — F17210 Nicotine dependence, cigarettes, uncomplicated: Secondary | ICD-10-CM | POA: Diagnosis present

## 2018-04-04 DIAGNOSIS — F4310 Post-traumatic stress disorder, unspecified: Secondary | ICD-10-CM | POA: Diagnosis not present

## 2018-04-04 DIAGNOSIS — S62339P Displaced fracture of neck of unspecified metacarpal bone, subsequent encounter for fracture with malunion: Secondary | ICD-10-CM | POA: Diagnosis present

## 2018-04-04 DIAGNOSIS — F333 Major depressive disorder, recurrent, severe with psychotic symptoms: Secondary | ICD-10-CM | POA: Diagnosis present

## 2018-04-04 DIAGNOSIS — W228XXD Striking against or struck by other objects, subsequent encounter: Secondary | ICD-10-CM | POA: Insufficient documentation

## 2018-04-04 DIAGNOSIS — R451 Restlessness and agitation: Secondary | ICD-10-CM | POA: Diagnosis not present

## 2018-04-04 DIAGNOSIS — Z590 Homelessness: Secondary | ICD-10-CM

## 2018-04-04 DIAGNOSIS — F312 Bipolar disorder, current episode manic severe with psychotic features: Secondary | ICD-10-CM | POA: Diagnosis not present

## 2018-04-04 DIAGNOSIS — F329 Major depressive disorder, single episode, unspecified: Secondary | ICD-10-CM | POA: Diagnosis not present

## 2018-04-04 DIAGNOSIS — F29 Unspecified psychosis not due to a substance or known physiological condition: Secondary | ICD-10-CM | POA: Diagnosis not present

## 2018-04-04 DIAGNOSIS — R9431 Abnormal electrocardiogram [ECG] [EKG]: Secondary | ICD-10-CM | POA: Diagnosis not present

## 2018-04-04 DIAGNOSIS — F1220 Cannabis dependence, uncomplicated: Secondary | ICD-10-CM | POA: Diagnosis present

## 2018-04-04 DIAGNOSIS — Z79899 Other long term (current) drug therapy: Secondary | ICD-10-CM | POA: Diagnosis not present

## 2018-04-04 DIAGNOSIS — F17200 Nicotine dependence, unspecified, uncomplicated: Secondary | ICD-10-CM | POA: Insufficient documentation

## 2018-04-04 DIAGNOSIS — S62306A Unspecified fracture of fifth metacarpal bone, right hand, initial encounter for closed fracture: Secondary | ICD-10-CM | POA: Diagnosis not present

## 2018-04-04 DIAGNOSIS — Z046 Encounter for general psychiatric examination, requested by authority: Secondary | ICD-10-CM | POA: Insufficient documentation

## 2018-04-04 DIAGNOSIS — S62306P Unspecified fracture of fifth metacarpal bone, right hand, subsequent encounter for fracture with malunion: Secondary | ICD-10-CM | POA: Diagnosis not present

## 2018-04-04 LAB — COMPREHENSIVE METABOLIC PANEL
ALT: 12 U/L (ref 0–44)
AST: 17 U/L (ref 15–41)
Albumin: 3.4 g/dL — ABNORMAL LOW (ref 3.5–5.0)
Alkaline Phosphatase: 62 U/L (ref 38–126)
Anion gap: 8 (ref 5–15)
BUN: 14 mg/dL (ref 6–20)
CHLORIDE: 106 mmol/L (ref 98–111)
CO2: 26 mmol/L (ref 22–32)
CREATININE: 0.92 mg/dL (ref 0.61–1.24)
Calcium: 8.6 mg/dL — ABNORMAL LOW (ref 8.9–10.3)
GFR calc Af Amer: >60 mL/min (ref >60)
GFR calc non Af Amer: >60 mL/min (ref >60)
Glucose, Bld: 102 mg/dL — ABNORMAL HIGH (ref 70–99)
POTASSIUM: 4 mmol/L (ref 3.5–5.1)
Sodium: 140 mmol/L (ref 135–145)
Total Bilirubin: 0.9 mg/dL (ref 0.3–1.2)
Total Protein: 6.5 g/dL (ref 6.5–8.1)

## 2018-04-04 LAB — CBC WITH DIFFERENTIAL/PLATELET
Abs Immature Granulocytes: 0.01 10*3/uL (ref 0.00–0.07)
Basophils Absolute: 0 10*3/uL (ref 0.0–0.1)
Basophils Relative: 0 %
Eosinophils Absolute: 0.1 10*3/uL (ref 0.0–0.5)
Eosinophils Relative: 2 %
HEMATOCRIT: 38.7 % — AB (ref 39.0–52.0)
HEMOGLOBIN: 12.3 g/dL — AB (ref 13.0–17.0)
Immature Granulocytes: 0 %
Lymphocytes Relative: 29 %
Lymphs Abs: 1.6 10*3/uL (ref 0.7–4.0)
MCH: 27.7 pg (ref 26.0–34.0)
MCHC: 31.8 g/dL (ref 30.0–36.0)
MCV: 87.2 fL (ref 80.0–100.0)
Monocytes Absolute: 0.6 10*3/uL (ref 0.1–1.0)
Monocytes Relative: 11 %
Neutro Abs: 3.2 10*3/uL (ref 1.7–7.7)
Neutrophils Relative %: 58 %
Platelets: 173 10*3/uL (ref 150–400)
RBC: 4.44 MIL/uL (ref 4.22–5.81)
RDW: 15.9 % — ABNORMAL HIGH (ref 11.5–15.5)
WBC: 5.4 10*3/uL (ref 4.0–10.5)
nRBC: 0 % (ref 0.0–0.2)

## 2018-04-04 LAB — RAPID URINE DRUG SCREEN, HOSP PERFORMED
AMPHETAMINES: NOT DETECTED
Barbiturates: NOT DETECTED
Benzodiazepines: NOT DETECTED
Cocaine: NOT DETECTED
Opiates: NOT DETECTED
Tetrahydrocannabinol: POSITIVE — AB

## 2018-04-04 LAB — ACETAMINOPHEN LEVEL: Acetaminophen (Tylenol), Serum: <10 ug/mL — ABNORMAL LOW (ref 10–30)

## 2018-04-04 LAB — SALICYLATE LEVEL: Salicylate Lvl: <7 mg/dL (ref 2.8–30.0)

## 2018-04-04 LAB — ETHANOL: Alcohol, Ethyl (B): <10 mg/dL (ref <10)

## 2018-04-04 MED ORDER — ACETAMINOPHEN 325 MG PO TABS
650.0000 mg | ORAL_TABLET | Freq: Four times a day (QID) | ORAL | Status: DC | PRN
  Administered 2018-04-04 – 2018-04-07 (×3): 650 mg via ORAL
  Filled 2018-04-04 (×3): qty 2

## 2018-04-04 MED ORDER — HYDROXYZINE HCL 25 MG PO TABS: 25.0000 mg | ORAL_TABLET | Freq: Three times a day (TID) | ORAL | Status: DC | PRN

## 2018-04-04 MED ORDER — NICOTINE 21 MG/24HR TD PT24: 21.0000 mg | MEDICATED_PATCH | Freq: Every day | TRANSDERMAL | Status: DC

## 2018-04-04 MED ORDER — MAGNESIUM HYDROXIDE 400 MG/5ML PO SUSP: 30.0000 mL | Freq: Every day | ORAL | Status: DC | PRN

## 2018-04-04 MED ORDER — ALUM & MAG HYDROXIDE-SIMETH 200-200-20 MG/5ML PO SUSP: 30.0000 mL | ORAL | Status: DC | PRN

## 2018-04-04 MED ORDER — HYDROXYZINE HCL 25 MG PO TABS: 25.0000 mg | ORAL_TABLET | Freq: Four times a day (QID) | ORAL | Status: DC | PRN

## 2018-04-04 MED ORDER — ACETAMINOPHEN 325 MG PO TABS: 650.0000 mg | ORAL_TABLET | Freq: Four times a day (QID) | ORAL | Status: DC | PRN

## 2018-04-04 MED ORDER — ACETAMINOPHEN 325 MG PO TABS: 650.0000 mg | ORAL_TABLET | ORAL | Status: DC | PRN

## 2018-04-04 MED ORDER — IBUPROFEN 600 MG PO TABS
600.0000 mg | ORAL_TABLET | Freq: Three times a day (TID) | ORAL | Status: DC | PRN
  Administered 2018-04-04: 600 mg via ORAL
  Filled 2018-04-04: qty 1

## 2018-04-04 NOTE — [facility] Group Notes (Signed)
[facility] LCSW Group Therapy Note  Date/Time: 04/04/18, 1315  Type of Therapy/Topic:  Group Therapy:  Balance in Life  Participation Level:  Did not attend  Description of Group:    This group will address the concept of balance and how it feels and looks when one is unbalanced. Patients will be encouraged to process areas in their lives that are out of balance, and identify reasons for remaining unbalanced. Facilitators will guide patients utilizing problem- solving interventions to address and correct the stressor making their life unbalanced. Understanding and applying boundaries will be explored and addressed for obtaining  and maintaining a balanced life. Patients will be encouraged to explore ways to assertively make their unbalanced needs known to significant others in their lives, using other group members and facilitator for support and feedback.  Therapeutic Goals: 1. Patient will identify two or more emotions or situations they have that consume much of in their lives. 2. Patient will identify signs/triggers that life has become out of balance:  3. Patient will identify two ways to set boundaries in order to achieve balance in their lives:  4. Patient will demonstrate ability to communicate their needs through discussion and/or role plays  Summary of Patient Progress:          Therapeutic Modalities:   Cognitive Behavioral Therapy Solution-Focused Therapy Assertiveness Training  Greg Walther [place], LCSW 
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
## 2018-04-04 NOTE — ED Notes (Signed)
Pt to got to [facility] at 1330 to room 508-2
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
## 2018-04-04 NOTE — ED Notes (Addendum)
Valuables taken to [employer] En[employer] # [phone]

## 2018-04-04 NOTE — ED Notes (Signed)
Report given to RN, Ronnie at [facility].

## 2018-04-04 NOTE — ED Notes (Signed)
pts items placed in locker # 5

## 2018-04-04 NOTE — Progress Notes (Signed)
Pt accepted to [facility], Bed 508-2  Nelly Rout, MD is the accepting provider.  Malvin Johns, MD is the attending provider.  Call report to [phone]  [facility] ED notified.   Patient requires IVC Pt may be transported by [employer] Pt scheduled  to arrive at [facility] @13 :30 PM or, once IVC'd.  Timmothy Euler. Kaylyn Lim, MSW, LCSWA Disposition Clinical Social Work [phone] (cell) [phone] (office)

## 2018-04-04 NOTE — Progress Notes (Signed)
A patient on [address] reported that Prestan was being inappropriate with her when coming back from the gym. This write spoke with Tasia Catchings and he understands and he will no longer be inappropriate. Pt was also willing to apologize to the pt on 400 hall. Pt will sit with a MHT at dinner at 5:30pm. RN is notified of Arlee behaviors.

## 2018-04-04 NOTE — Progress Notes (Signed)
Patient ID: Nathaniel Griffin, male   DOB: Jan 05, 1961, 58 y.o.   MRN: [id number]  Admission Note  D) Patient admitted to the adult unit 500 hall. Patient is a 58 year old male who is under IVC from [facility]. Patient presents with loud, tangential speech and has a silly/animated/euphoric affect. Patient appears manic but is pleasant and makes jokes with staff. Patient was cooperative with the admission process and signed for consents. Patient does appear intrusive at times and was dancing when he walked onto the unit. Patient vitals obtained; BP elevated. Patient with history of HTN. Patient denies any SI/HI or AVH. Patient denies legal issues and was a poor historian during the assessment. 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 Patient is very focused on discharge and states, "I do not want to be here, I need to be out in two days by my birthday so I can see my grandbabies". Patient reports, "I don't want to talk to my sister, and you can't either", but does report his daughter Nathaniel Griffin is the only person who can visit. Patient agreeable to coming onto the unit but reports, "I want to talk to the doctor so I can get out of here".  Skin assessment was completed and unremarkable. Patient belongings searched with no contraband found. Belongings secured in locker. Snacks and fluids offered.    A) Plan of care, unit policies and patient expectations were explained. Written consents obtained. Patient oriented to the unit and their room. Patient placed on standard q15 safety checks. Low fall risk precautions initiated and reviewed with patient.   R) Patient is in no acute distress and verbalizes understanding of information provided. Patient with no concerns at this time. Patient contracts for safety with staff on the unit. Report given to receiving RN.

## 2018-04-04 NOTE — ED Notes (Signed)
[employer] called for transportation  

## 2018-04-04 NOTE — ED Provider Notes (Signed)
Medical screening examination/treatment/procedure(s) were conducted as a shared visit with non-physician practitioner(s) and myself.  I personally evaluated the patient during the encounter.  EKG Interpretation  Date/Time:  Thursday April 04 2018 84:13:24 EST Ventricular Rate:  62 PR Interval:    QRS Duration: 76 QT Interval:  404 QTC Calculation: 411 R Axis:   87 Text Interpretation:  Sinus rhythm Baseline wander in lead(s) V5 normal no change from previous Confirmed by Arby Barrette [phone]) on 04/04/2018 8:23:18 AM Patient with history of PTSD brought by [facility] for psychiatric evaluation.  Per patient he reports a lot of the problems are between himself and his family.  He reports that he can get very agitated in certain situations.  He reports he is not "crazy" but due to dealing with a lot of social issues including homelessness and familial interactions, he sometimes becomes very emotional.  Patient is alert and nontoxic.  Clinically well in appearance.  Interactive and appropriate.  No signs of responding to external stimuli.  Heart regular.  Lungs clear.  Abdomen soft nontender.  All movements coordinated and purposeful.  Patient is medically cleared for psychiatric assessment.   Arby Barrette, MD 04/04/18 [phone]

## 2018-04-04 NOTE — Progress Notes (Signed)
Patient stated that he had a bad start to his day but that things ended on a positive note. He is interested in going home since his birthday will occur in two days and because he misses his grandchild. 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 His goal for tomorrow is to get discharged.

## 2018-04-04 NOTE — ED Notes (Signed)
bfast tray should come about 10 am
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
## 2018-04-04 NOTE — Tx Team (Signed)
Initial Treatment Plan 04/04/2018 4:15 PM LINDBURG BANNER TIR:[id number]    PATIENT STRESSORS: Health problems Medication change or noncompliance   PATIENT STRENGTHS: Physical Health Supportive family/friends   PATIENT IDENTIFIED PROBLEMS: "I do not need to be here"  Psychosis                   DISCHARGE CRITERIA:  Ability to meet basic life and health needs Adequate post-discharge living arrangements Improved stabilization in mood, thinking, and/or behavior Medical problems require only outpatient monitoring Motivation to continue treatment in a less acute level of care Need for constant or close observation no longer present Reduction of life-threatening or endangering symptoms to within safe limits Safe-care adequate arrangements made Verbal commitment to aftercare and medication compliance  PRELIMINARY DISCHARGE PLAN: Outpatient therapy  PATIENT/FAMILY INVOLVEMENT: This treatment plan has been presented to and reviewed with the patient, Nathaniel Griffin.  The patient and family have been given the opportunity to ask questions and make suggestions.  Ferrel Logan, RN 04/04/2018, 4:15 PM

## 2018-04-05 DIAGNOSIS — F312 Bipolar disorder, current episode manic severe with psychotic features: Principal | ICD-10-CM

## 2018-04-05 MED ORDER — PROPRANOLOL HCL 20 MG PO TABS
20.0000 mg | ORAL_TABLET | Freq: Two times a day (BID) | ORAL | Status: DC
  Administered 2018-04-05 – 2018-04-09 (×7): 20 mg via ORAL
  Filled 2018-04-05 (×11): qty 1
  Filled 2018-04-05: qty 2

## 2018-04-05 MED ORDER — HALOPERIDOL 5 MG PO TABS
10.0000 mg | ORAL_TABLET | Freq: Three times a day (TID) | ORAL | Status: DC
  Administered 2018-04-05 – 2018-04-09 (×10): 10 mg via ORAL
  Filled 2018-04-05 (×17): qty 2

## 2018-04-05 MED ORDER — LORAZEPAM 1 MG PO TABS
2.0000 mg | ORAL_TABLET | ORAL | Status: DC
  Filled 2018-04-05: qty 2

## 2018-04-05 MED ORDER — TEMAZEPAM 15 MG PO CAPS
30.0000 mg | ORAL_CAPSULE | Freq: Every day | ORAL | Status: DC
  Administered 2018-04-05 – 2018-04-08 (×4): 30 mg via ORAL
  Filled 2018-04-05 (×4): qty 2

## 2018-04-05 MED ORDER — DIPHENHYDRAMINE HCL 50 MG PO CAPS
50.0000 mg | ORAL_CAPSULE | Freq: Three times a day (TID) | ORAL | Status: DC
  Administered 2018-04-05 – 2018-04-09 (×10): 50 mg via ORAL
  Filled 2018-04-05 (×11): qty 1
  Filled 2018-04-05: qty 2
  Filled 2018-04-05 (×5): qty 1

## 2018-04-05 MED ORDER — LORAZEPAM 2 MG/ML IJ SOLN
4.0000 mg | Freq: Four times a day (QID) | INTRAMUSCULAR | Status: DC | PRN
  Administered 2018-04-06: 4 mg via INTRAMUSCULAR
  Filled 2018-04-05 (×2): qty 2

## 2018-04-05 MED ORDER — LORAZEPAM 2 MG/ML IJ SOLN: 4.0000 mg | Freq: Four times a day (QID) | INTRAMUSCULAR | Status: DC

## 2018-04-05 MED ORDER — LORAZEPAM 2 MG/ML IJ SOLN: 2.0000 mg | INTRAMUSCULAR | Status: DC

## 2018-04-05 MED ORDER — ZIPRASIDONE MESYLATE 20 MG IM SOLR
20.0000 mg | INTRAMUSCULAR | Status: DC | PRN
  Administered 2018-04-06: 20 mg via INTRAMUSCULAR
  Filled 2018-04-05 (×2): qty 20

## 2018-04-05 MED ORDER — LORAZEPAM 2 MG/ML IJ SOLN: 2.0000 mg | Freq: Four times a day (QID) | INTRAMUSCULAR | Status: DC | PRN

## 2018-04-05 MED ORDER — LORAZEPAM 1 MG PO TABS
2.0000 mg | ORAL_TABLET | Freq: Four times a day (QID) | ORAL | Status: DC | PRN
  Administered 2018-04-05 – 2018-04-07 (×3): 2 mg via ORAL
  Filled 2018-04-05 (×2): qty 2
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
## 2018-04-05 NOTE — Tx Team (Signed)
Interdisciplinary Treatment and Diagnostic Plan Update  04/05/2018 Time of Session: [place] MRN: [id number]  Principal Diagnosis: <principal problem not specified>  Secondary Diagnoses: Active Problems:   MDD (major depressive disorder), recurrent, severe, with psychosis ([place])   Bipolar I disorder, current or most recent episode manic, with psychotic features ([place])   Current Medications:  Current Facility-Administered Medications  Medication Dose Route Frequency Provider Last Rate Last Dose  . acetaminophen (TYLENOL) tablet 650 mg  650 mg Oral Q6H PRN Connye Burkitt, NP   650 mg at 04/05/18 7858  . alum & mag hydroxide-simeth (MAALOX/MYLANTA) 200-200-20 MG/5ML suspension 30 mL  30 mL Oral Q4H PRN Connye Burkitt, NP      . diphenhydrAMINE (BENADRYL) capsule 50 mg  50 mg Oral TID Johnn Hai, MD   50 mg at 04/05/18 0845  . haloperidol (HALDOL) tablet 10 mg  10 mg Oral TID Johnn Hai, MD   10 mg at 04/05/18 8502  . hydrOXYzine (ATARAX/VISTARIL) tablet 25 mg  25 mg Oral TID PRN Connye Burkitt, NP      . ibuprofen (ADVIL,MOTRIN) tablet 600 mg  600 mg Oral Q8H PRN Cobos, Myer Peer, MD   600 mg at 04/04/18 2300  . LORazepam (ATIVAN) tablet 2 mg  2 mg Oral Q6H PRN Johnn Hai, MD   2 mg at 04/05/18 0845   Or  . LORazepam (ATIVAN) injection 2 mg  2 mg Intramuscular Q6H PRN Johnn Hai, MD      . LORazepam (ATIVAN) injection 4 mg  4 mg Intramuscular Q6H PRN Johnn Hai, MD      . magnesium hydroxide (MILK OF MAGNESIA) suspension 30 mL  30 mL Oral Daily PRN Connye Burkitt, NP      . propranolol (INDERAL) tablet 20 mg  20 mg Oral BID Johnn Hai, MD   20 mg at 04/05/18 0843  . temazepam (RESTORIL) capsule 30 mg  30 mg Oral QHS Johnn Hai, MD      . ziprasidone (GEODON) injection 20 mg  20 mg Intramuscular Q4H PRN Johnn Hai, MD       PTA Medications: Medications Prior to Admission  Medication Sig Dispense Refill Last Dose  . HYDROcodone-acetaminophen (NORCO) 5-325 MG  tablet Take 1 tablet by mouth every 6 (six) hours as needed for moderate pain. (Patient not taking: Reported on 04/04/2018) 5 tablet 0 Not Taking at Unknown time  . ibuprofen (ADVIL,MOTRIN) 800 MG tablet Take 1 tablet (800 mg total) by mouth 3 (three) times daily. (Patient not taking: Reported on 04/04/2018) 21 tablet 0 Not Taking at Unknown time  . methocarbamol (ROBAXIN) 500 MG tablet Take 1 tablet (500 mg total) by mouth 2 (two) times daily. (Patient not taking: Reported on 04/04/2018) 20 tablet 0 Not Taking at Unknown time  . neomycin-polymyxin-hydrocortisone (CORTISPORIN) 3.5-10000-1 OTIC suspension Place 3 drops into the left ear 4 (four) times daily. (Patient not taking: Reported on 04/04/2018) 10 mL 0 Not Taking at Unknown time  . sulfamethoxazole-trimethoprim (SEPTRA DS) 800-160 MG per tablet Take 1 tablet by mouth every 12 (twelve) hours. (Patient not taking: Reported on 04/04/2018) 14 tablet 0 Not Taking at Unknown time    Patient Stressors: Health problems Medication change or noncompliance  Patient Strengths: Physical Health Supportive family/friends  Treatment Modalities: Medication Management, Group therapy, Case management,  1 to 1 session with clinician, Psychoeducation, Recreational therapy.   Physician Treatment Plan for Primary Diagnosis: <principal problem not specified> Long Term Goal(s): Improvement in symptoms so  as ready for discharge Improvement in symptoms so as ready for discharge   Short Term Goals: Ability to disclose and discuss suicidal ideas Ability to maintain clinical measurements within normal limits will improve  Medication Management: Evaluate patient's response, side effects, and tolerance of medication regimen.  Therapeutic Interventions: 1 to 1 sessions, Unit Group sessions and Medication administration.  Evaluation of Outcomes: Not Met  Physician Treatment Plan for Secondary Diagnosis: Active Problems:   MDD (major depressive disorder), recurrent,  severe, with psychosis ([place])   Bipolar I disorder, current or most recent episode manic, with psychotic features ([place])  Long Term Goal(s): Improvement in symptoms so as ready for discharge Improvement in symptoms so as ready for discharge   Short Term Goals: Ability to disclose and discuss suicidal ideas Ability to maintain clinical measurements within normal limits will improve     Medication Management: Evaluate patient's response, side effects, and tolerance of medication regimen.  Therapeutic Interventions: 1 to 1 sessions, Unit Group sessions and Medication administration.  Evaluation of Outcomes: Not Met   RN Treatment Plan for Primary Diagnosis: <principal problem not specified> Long Term Goal(s): Knowledge of disease and therapeutic regimen to maintain health will improve  Short Term Goals: Ability to identify and develop effective coping behaviors will improve and Compliance with prescribed medications will improve  Medication Management: RN will administer medications as ordered by provider, will assess and evaluate patient's response and provide education to patient for prescribed medication. RN will report any adverse and/or side effects to prescribing provider.  Therapeutic Interventions: 1 on 1 counseling sessions, Psychoeducation, Medication administration, Evaluate responses to treatment, Monitor vital signs and CBGs as ordered, Perform/monitor CIWA, COWS, AIMS and Fall Risk screenings as ordered, Perform wound care treatments as ordered.  Evaluation of Outcomes: Not Met   LCSW Treatment Plan for Primary Diagnosis: <principal problem not specified> Long Term Goal(s): Safe transition to appropriate next level of care at discharge, Engage patient in therapeutic group addressing interpersonal concerns.  Short Term Goals: Engage patient in aftercare planning with referrals and resources, Increase social support and Increase skills for wellness and recovery  Therapeutic  Interventions: Assess for all discharge needs, 1 to 1 time with Social worker, Explore available resources and support systems, Assess for adequacy in community support network, Educate family and significant other(s) on suicide prevention, Complete Psychosocial Assessment, Interpersonal group therapy.  Evaluation of Outcomes: Not Met   Progress in Treatment: Attending groups: No. Participating in groups: No. Taking medication as prescribed: Yes. Toleration medication: Yes. Family/Significant other contact made: No, will contact:  daughter Patient understands diagnosis: No. Discussing patient identified problems/goals with staff: Yes. Medical problems stabilized or resolved: Yes. Denies suicidal/homicidal ideation: Yes. Issues/concerns per patient self-inventory: No. Other: none  New problem(s) identified: No, Describe:  none  New Short Term/Long Term Goal(s):  Patient Goals: Pt unable to participate in meeting today.  Agitated.   Discharge Plan or Barriers:   Reason for Continuation of Hospitalization: [place] Medication stabilization  Estimated Length of Stay:5-7 days.  Attendees: Patient: 04/05/2018   Physician: Dr. Jake Samples, MD 04/05/2018   Nursing: Sena Hitch, RN 04/05/2018   RN Care Manager: 04/05/2018   Social Worker: Lurline Idol, LCSW 04/05/2018   Recreational Therapist:  04/05/2018   Other:  04/05/2018   Other:  04/05/2018   Other: 04/05/2018        Scribe for Treatment Team: Joanne Chars, [place] 04/05/2018 11:53 AM

## 2018-04-05 NOTE — Plan of Care (Signed)
  Problem: Activity: Goal: Will verbalize the importance of balancing activity with adequate rest periods Outcome: Progressing   Problem: Safety: Goal: Ability to redirect hostility and anger into socially appropriate behaviors will improve Outcome: Progressing  DAR NOTE: Patient presents with irritable affect and mood when approach about morning medication.  Patient was argumentative and demanding to leave.  Patient states he doesn't take psych medication and he is not going to start now.  Patient was able to take his medication with several encouragement and show of support.  Denies suicidal thoughts, auditory and visual hallucinations.  Rates depression at 10, hopelessness at 0, and anxiety at 10.  Maintained on routine safety checks.  Support and encouragement offered as needed.  Attended group and participated.  States goal for today is "go home."  Patient observed socializing with peers in the dayroom.  Patient is safe on and off the unit.

## 2018-04-05 NOTE — [facility] Suicide Risk Assessment (Addendum)
[facility] INPATIENT:  Family/Significant Other Suicide Prevention Education  Suicide Prevention Education:  Contact Attempts: Warnell Forester, daughter, [phone], has been identified by the patient as the family member/significant other with whom the patient will be residing, and identified as the person(s) who will aid the patient in the event of a mental health crisis.  With written consent from the patient, two attempts were made to provide suicide prevention education, prior to and/or following the patient's discharge.  We were unsuccessful in providing suicide prevention education.  A suicide education pamphlet was given to the patient to share with family/significant other.  Date and time of first attempt:04/05/18, 1303 Date and time of second attempt:04/08/18, 1246  Lorri Frederick, LCSW 04/05/2018, 1:03 PM

## 2018-04-05 NOTE — [facility] Group Notes (Signed)
Date: 04/05/18, 1300  Type of Therapy and Topic: Chaplain group, "Hope is.." Chaplain engaged group in discussion about hope and what it looks like in each patients life and current situation.  Participation level:active  Modes of Intervention: Discussion, Education and Socialization  Summary of Progress/Problems:Pt shared that hope is connected to his faith and his belief in God.  Pt praying that he will be allowed to go home today.     Lorri Frederick, LCSW   [facility] LCSW Group Therapy Note

## 2018-04-05 NOTE — [facility] Group Notes (Signed)
Pt was invited but did not attend orientation group. 

## 2018-04-05 NOTE — [facility] Suicide Risk Assessment (Signed)
[facility] Admission Suicide Risk Assessment   Nursing information obtained from:  Patient, Review of record Demographic factors:  Male, Low socioeconomic status, Unemployed Current Mental Status:  NA Loss Factors:  NA Historical Factors:  NA Risk Reduction Factors:  Positive social support  Total Time spent with patient: 45 minutes Principal Problem: Bipolar manic with psychosis/cannabis dependency Diagnosis:  Active Problems:   MDD (major depressive disorder), recurrent, severe, with psychosis ([facility])   Bipolar I disorder, current or most recent episode manic, with psychotic features ([facility])  Subjective Data: Manic pressured rambling uncooperative  Continued Clinical Symptoms:  Alcohol Use Disorder Identification Test Final Score (AUDIT): 0 The "Alcohol Use Disorders Identification Test", Guidelines for Use in Primary Care, Second Edition.  World Science writer [facility]). Score between 0-7:  no or low risk or alcohol related problems. Score between 8-15:  moderate risk of alcohol related problems. Score between 16-19:  high risk of alcohol related problems. Score 20 or above:  warrants further diagnostic evaluation for alcohol dependence and treatment.   CLINICAL FACTORS:   Bipolar Disorder:   Mixed State   COGNITIVE FEATURES THAT CONTRIBUTE TO RISK:  Closed-mindedness, Loss of executive function and Polarized thinking    SUICIDE RISK:   Mild:  Suicidal ideation of limited frequency, intensity, duration, and specificity.  There are no identifiable plans, no associated intent, mild dysphoria and related symptoms, good self-control (both objective and subjective assessment), few other risk factors, and identifiable protective factors, including available and accessible social support.  PLAN OF CARE: Reevaluate for possible forced medications  I certify that inpatient services furnished can reasonably be expected to improve the patient's condition.   Malvin Johns, MD 04/05/2018, 7:58  AM

## 2018-04-05 NOTE — Progress Notes (Addendum)
Nathaniel Griffin has been up all night.  He has been out of his room multiple times pacing the hall.  He keeps stating that "I don't need to be in the coo coo house.  I shouldn't be here."  He has threatened to prosecute his sister for being here.  He keeps stating he was in the [employer] and being "locked up here reminds me of being in prison and making my PTSD worse."  He remains hyper verbal, hyper religious, loud and argumentative.  Unable to be redirected at times.  He declines needing medication to help him calm down.  We will continue to monitor the progress towards his goals.

## 2018-04-05 NOTE — Progress Notes (Signed)
D:  Nathaniel Griffin has been up and visible on the unit.  He attended evening wrap up group.  He was pleasant and cooperative.  He remains hyper verbal, loud and joking with staff and peers.  He denied SI/HI or A/V hallucinations.  He believes that he doesn't belong here and doesn't want to take medication since he has never been on psychiatric medications "in my life."  He complained of pain in right had due to fracture he sustained in December 2019.  He stated he was supposed to have surgery on the had earlier this month but family member passed away and was unable to have that done.  Surgery had to be reschedule for beginning of March.  He took tylenol earlier good relief but pain returned.  Called provider on call and obtained order for Ibuprofen 600mg  which he took for pain 5/10 and achieved good relief.  Ice pack was also given.  He has been up and down all evening but continued to decline needing medication.  He is focused on getting discharged by his birthday on Saturday. A:  1:1 with RN for support and encouragement.  Offered medications as ordered.  Q 15 minute checks maintained for safety.  Encouraged participation in group and unit activities.   R:  [place] remains safe on the unit.  We will continue to monitor the progress towards his goals.
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
## 2018-04-05 NOTE — Plan of Care (Signed)
  Problem: Education: Goal: Will be free of psychotic symptoms Outcome: Progressing   Problem: Coping: Goal: Will verbalize feelings Outcome: Progressing   Problem: Role Relationship: Goal: Ability to interact with others will improve Outcome: Progressing

## 2018-04-06 DIAGNOSIS — F333 Major depressive disorder, recurrent, severe with psychotic symptoms: Secondary | ICD-10-CM

## 2018-04-06 NOTE — [facility] Group Notes (Signed)
[facility] Group Notes: (Clinical Social Work)   04/06/2018      Type of Therapy:  Group Therapy   Participation Level:  Did Not Attend despite MHT prompting   Ambrose Mantle, LCSW 04/06/2018, 5:44 PM

## 2018-04-06 NOTE — Progress Notes (Signed)
Pt immediately woke up, came up to the nurses station complaining that "I'm here on my birthday, this is fucked up."  "I'm not going to be with these coo coo's."  Cursing loudly and blaming the white folks, the crackers."  Loud, cursing staff and demanding to leave.  "I just want to be out holding my one year old grand daughter."  Poor insight and difficult to redirect.

## 2018-04-06 NOTE — [facility] Group Notes (Signed)
[facility] Group Notes:  (Nursing/MHT/Case Management/Adjunct)  Date:  04/06/2018  Time:  6:36 PM  Type of Therapy:  Nurse Education  Participation Level:  Did Not Attend  Summary of Progress/Problems: In this group, we discussed anger management. First, we identified myths about anger. Then we explored the different coping skills to deal with anger. Finally, we talked about rating our anger and how to have a healthy, assertive conversation versus a hostile one. The patient did not attend the group.  Kirstie Mirza 04/06/2018, 6:36 PM
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
## 2018-04-06 NOTE — Plan of Care (Signed)
D: Patient presents agitated, elevated. He came out in the hall screaming this morning "It is my birthday, and I am going home; you had better let me outta here!" He was beligerent and difficult to redirect. He kept stating how he didn't need any medication, and "all I need is Jesus." He reported wanting to be with his 56 yr old grandchild. PRN medication was administered IM for acute psychotic agitation. Patient denies SI/HI/AVH. Patient has been asleep all morning, will monitor closely. He slept well last night, and did not request medication. His appetite is fair, energy normal and concentration good. He rates his depression 5/10 and anxiety and hopelessness 0/10. He denies physical symptoms or withdrawal complaints.  A: Patient checked q15 min, and checks reviewed. Reviewed medication changes with patient and educated on side effects. Educated patient on importance of attending group therapy sessions and educated on several coping skills. Encouarged participation in milieu through recreation therapy and attending meals with peers. Support and encouragement provided. Fluids offered. R: Patient receptive to education on medications, and is medication compliant. Patient contracts for safety on the unit. "Get out of here" and "Be nice and follow instructions" and "when can I leave?"  Problem: Education: Goal: Will be free of psychotic symptoms Outcome: Not Progressing Goal: Knowledge of the prescribed therapeutic regimen will improve Outcome: Not Progressing   Problem: Education: Goal: Emotional status will improve Outcome: Not Progressing Goal: Mental status will improve Outcome: Not Progressing

## 2018-04-06 NOTE — Progress Notes (Signed)
Shortly after change of shift, pt was agitated in hallway yelling at peer.  He states "it's going to be problems if he puts his hands on me.  I'll drop him like an old transmission."  Staff positioned themselves between the two patients and were able to de-escalate the situation.  Pt is focused on discharge and is irritated that he is at [facility] on his birthday.  Pt denies SI/HI, hallucinations, and pain.    Introduced self to pt.  Actively listened to pt and offered support and encouragement.  Verbally de-escalated pt as needed.  HS medication administered early due to pt's agitation; on-site provider notified.  PRN medication administered for anxiety.  Fall prevention techniques reviewed with pt multiple times, as his gait was unsteady in hallway at one point in the evening.  Q15 minute safety checks maintained.  Pt is compliant with medications.  He verbally contracts for safety but needs redirection at times.  Pt is safe on the unit.  Will continue to monitor and assess.

## 2018-04-06 NOTE — Progress Notes (Signed)
D:  Nathaniel Griffin was sitting quietly in the day room much of the evening.  He attended evening wrap up group and was noted interacting well with peers.  When it was time for hs medications, he became argumentative about being in the hospital, tangential and was difficult to redirect to current topic.  He kept rambling that he didn't want to die like "Donn Pierini" because of all the medications or be like his father with medications.  "I don't need medications." Insight remains poor as he talks about demanding to leave tomorrow because "It's my birthday and I don't need to be here.  When I leave, I am going to call the courts and have my sister put into the hospital because she needs the treatment, not me."  He remained argumentative until he went to his room to lay down for the evening.  He did come back up few minutes later to apologize to RN and promptly returned to his room.  He remains awake in his room after Restoril.  A:  1:1 with RN for support and encouragement.  Medications as ordered.  Q 15 minute checks maintained for safety.  Encouraged participation in group and unit activities.   R:  Nathaniel Griffin remains safe on the unit.  We will continue to monitor the progress towards his goals.

## 2018-04-07 NOTE — Progress Notes (Signed)
D: Pt was in bed in his room upon initial approach.  Pt presents with anxious, depressed affect and mood.  He describes his day as "a little discouraging but I'm living."  Pt states "I'm still here so I can't complain."  Pt denies SI/HI, denies hallucinations, reports R hand pain of 3/10.  Pt has been visible in milieu interacting with peers and staff appropriately.  Pt attended evening group.    A: Introduced self to pt.  Met with pt 1:1.  Actively listened to pt and offered support and encouragement.  Medication administered per order.  PRN medication administered for pain and anxiety.  Q15 minute safety checks maintained.  R: Pt is safe on the unit.  Pt is compliant with medications.  Pt verbally contracts for safety.  Will continue to monitor and assess.

## 2018-04-07 NOTE — Progress Notes (Signed)
Pt came walking down the hall to the nursing station very unsteady. Pt was encouraged to go back to his room and lay down. Pt was escorted by Clinical research associate to his room. Pt proceeded to state he wanted to take a shower. Pt was informed he was too unsteady at this time . Pt was informed if he was walking better after 5:30 am pt would be ok to take a shower. Pt agreed with Clinical research associate and got back in the bed and appeared to go to sleep.

## 2018-04-07 NOTE — Plan of Care (Signed)
D: Patient presents labile. He still feels he does not need medication, however, he is now medication compliant. He slept well last night and received medication that was helpful. His appetite is good, energy high and concentration good. He rates his depression 2/10. He denies withdrawal symptoms or physical complaints. Patient denies SI/HI/AVH. I am concerned he may become upset if he is not discharged today, so will monitor him closely.  A: Patient checked q15 min, and checks reviewed. Reviewed medication changes with patient and educated on side effects. Educated patient on importance of attending group therapy sessions and educated on several coping skills. Encouarged participation in milieu through recreation therapy and attending meals with peers. Support and encouragement provided. Fluids offered. R: Patient receptive to education on medications, and is medication compliant. Patient contracts for safety on the unit. Goal: "To get out today."   Problem: Coping: Goal: Will verbalize feelings Outcome: Progressing   Problem: Education: Goal: Will be free of psychotic symptoms Outcome: Not Progressing Goal: Knowledge of the prescribed therapeutic regimen will improve Outcome: Not Progressing   Problem: Coping: Goal: Coping ability will improve Outcome: Not Progressing

## 2018-04-07 NOTE — [facility] Group Notes (Signed)
[facility] LCSW Group Therapy Note  Date/Time:  04/07/2018  11:00AM-12:00PM  Type of Therapy and Topic:  Group Therapy:  Music and Mood  Participation Level:  Active   Description of Group: In this process group, members listened to a variety of genres of music and identified that different types of music evoke different responses.  Patients were encouraged to identify music that was soothing for them and music that was energizing for them.  Patients discussed how this knowledge can help with wellness and recovery in various ways including managing depression and anxiety as well as encouraging healthy sleep habits.    Therapeutic Goals: 1. Patients will explore the impact of different varieties of music on mood 2. Patients will verbalize the thoughts they have when listening to different types of music 3. Patients will identify music that is soothing to them as well as music that is energizing to them 4. Patients will discuss how to use this knowledge to assist in maintaining wellness and recovery 5. Patients will explore the use of music as a coping skill  Summary of Patient Progress:  At the beginning of group, patient expressed that he felt "sad, confused, angry at being here, like it's wasted time."  During group he gradually started smiling and then he danced at the last song.  Therapeutic Modalities: Solution Focused Brief Therapy Activity   Ambrose Mantle, LCSW
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
## 2018-04-07 NOTE — [facility] Group Notes (Signed)
[facility] Group Notes:  (Nursing/MHT/Case Management/Adjunct)  Date:  04/07/2018  Time:  6:21 PM  Type of Therapy:  Nurse Education  Participation Level:  Active  Participation Quality:  Appropriate and Attentive  Affect:  Appropriate  Cognitive:  Alert and Appropriate  Insight:  Good  Engagement in Group:  Improving  Modes of Intervention:  Activity and Discussion  Summary of Progress/Problems: In this group, we discussed healthy communication and how to express our needs to others. We covered how to make appropriate "I feel" statements. We also discussed the 5 love languages and took the love language quiz in the program handbook.   Kirstie Mirza 04/07/2018, 6:21 PM

## 2018-04-08 MED ORDER — HALOPERIDOL DECANOATE 100 MG/ML IM SOLN
150.0000 mg | Freq: Once | INTRAMUSCULAR | Status: AC
  Administered 2018-04-08: 150 mg via INTRAMUSCULAR
  Filled 2018-04-08: qty 1.5

## 2018-04-08 MED ORDER — HALOPERIDOL DECANOATE 100 MG/ML IM SOLN: 150.0000 mg | Freq: Once | INTRAMUSCULAR | 11 refills | Status: AC

## 2018-04-08 MED ORDER — TEMAZEPAM 30 MG PO CAPS: 30.0000 mg | ORAL_CAPSULE | Freq: Every day | ORAL | 1 refills | Status: AC

## 2018-04-08 MED ORDER — HALOPERIDOL 10 MG PO TABS: 10.0000 mg | ORAL_TABLET | Freq: Two times a day (BID) | ORAL | 1 refills | Status: AC

## 2018-04-08 MED ORDER — PROPRANOLOL HCL 40 MG PO TABS: 40.0000 mg | ORAL_TABLET | Freq: Two times a day (BID) | ORAL | 2 refills | Status: AC

## 2018-04-08 NOTE — Progress Notes (Signed)
D: Pt denies SI/HI/AVH. Pt is pleasant and cooperative. Pt stated he was doing ok, pt ready to D/C tomorrow. Pt appropriate on the unit this evening.  A: Pt was offered support and encouragement. Pt was given scheduled medications. Pt was encourage to attend groups. Q 15 minute checks were done for safety.  R:Pt attends groups and interacts well with peers and staff. Pt is taking medication. Pt has no complaints.Pt receptive to treatment and safety maintained on unit.

## 2018-04-08 NOTE — Progress Notes (Signed)
CSW spoke to pt, who was quite irritated that he was not being discharged today.  Pt said he is taking his medications and is willing to continue after discharge--pt planning to stay with his sister Waldron Session in [place] after discharge but only gave CSW permission to speak to his other sister, Winferd Humphrey, when CSW informed him that I had been unable to reach his daughter for SPE.  Pt is willing to do his follow up at [facility]. Garner Nash, MSW, LCSW Clinical Social Worker 04/08/2018 10:05 AM

## 2018-04-08 NOTE — Progress Notes (Addendum)
CSW spoke to pt sister,Nathaniel Griffin, [phone].  She reports pt cannot stay with the other sister, Nathaniel Griffin, due to problems when he was there before.  Daughter Nathaniel Griffin changed her phone number because pt was calling and causing her problems as well.  Nathaniel Griffin will contact [facility] and let her know that pt is discharging today.  Nathaniel Griffin is trying to help get pt's car back running. Nathaniel Griffin, MSW, LCSW Clinical Social Worker 04/08/2018 12:59 PM   CSW updated pt on the above conversation and encouraged him to call his sister Nathaniel Griffin.  He came back to CSW after group and said that he is willing to wait for them to buy a car battery so that his car works and that he now plans to go to his brother's home in [place].  He has switched back and forth several times about staying in [place] or going back to [place]: currently saying he will stay in [place]. Nathaniel Griffin, MSW, LCSW Clinical Social Worker 04/08/2018 2:35 PM   CSW spoke to Nathaniel Griffin again.  Plan is for daughter to get the car fixed tonight--will come by to get keys and have the car ready tomorrow.  Pt brother, Nathaniel Griffin, is in [place] and is an option for pt to stay with. Nathaniel Griffin, MSW, LCSW Clinical Social Worker 04/08/2018 3:32 PM

## 2018-04-08 NOTE — Progress Notes (Signed)
Recreation Therapy Notes  Date: 2.24.20 Time: 1000 Location: 500 Hall Dayroom  Group Topic: Coping Skills  Goal Area(s) Addresses:  Patient will identify positive coping skills. Patient will identify benefit of using coping strategies post d/c.Marland Kitchen  Behavioral Response:  None  Intervention: Worksheet  Activity: Healthy vs. Unhealthy Coping Strategies.  Patients were to describe a problem they are currently dealing with.  Patients then identified the unhealthy coping strategies they currently use and the consequences of those unhealthy coping strategies.  Patients would then identify healthy coping strategies they use or could use.  Patients would identify the expected outcomes of the healthy coping strategies and barriers that prevent them from using their positive coping strategies.  Education: Pharmacologist, Building control surveyor.   Education Outcome: Acknowledges understanding/In group clarification offered/Needs additional education.   Clinical Observations/Feedback: Pt came in late to group.  Pt sat and listened.     Caroll Rancher, LRT/CTRS    Caroll Rancher A 04/08/2018 11:52 AM

## 2018-04-08 NOTE — [facility] Suicide Risk Assessment (Signed)
[facility] Discharge Suicide Risk Assessment   Principal Problem: Exacerbation and underlying bipolar condition with mania and lack of insight Discharge Diagnoses: Active Problems:   MDD (major depressive disorder), recurrent, severe, with psychosis (HCC)   Bipolar I disorder, current or most recent episode manic, with psychotic features (HCC)   Total Time spent with patient: 45 minutes Alert and oriented continues to be hyper religious at intervals no thoughts of harming self or others mania has been subdued physically  Mental Status Per Nursing Assessment::   On Admission:  NA  Demographic Factors:  Male  Loss Factors: Decrease in vocational status  Historical Factors: Impulsivity  Risk Reduction Factors:   Strong spiritual beliefs, sense of responsibility to family, improving insight into condition  Continued Clinical Symptoms:  Bipolar Disorder:   Mixed State  Cognitive Features That Contribute To Risk:  Loss of executive function    Suicide Risk:  Minimal: No identifiable suicidal ideation.  Patients presenting with no risk factors but with morbid ruminations; may be classified as minimal risk based on the severity of the depressive symptoms    Plan Of Care/Follow-up recommendations:  Activity:  full  Yazlyn Wentzel, MD 04/08/2018, 11:23 AM

## 2018-04-08 NOTE — [facility] Group Notes (Signed)
[facility] LCSW Group Therapy Note  Date/Time: 04/08/18, 1315  Type of Therapy and Topic:  Group Therapy:  Overcoming Obstacles  Participation Level:  active  Description of Group:    In this group patients will be encouraged to explore what they see as obstacles to their own wellness and recovery. They will be guided to discuss their thoughts, feelings, and behaviors related to these obstacles. The group will process together ways to cope with barriers, with attention given to specific choices patients can make. Each patient will be challenged to identify changes they are motivated to make in order to overcome their obstacles. This group will be process-oriented, with patients participating in exploration of their own experiences as well as giving and receiving support and challenge from other group members.  Therapeutic Goals: 1. Patient will identify personal and current obstacles as they relate to admission. 2. Patient will identify barriers that currently interfere with their wellness or overcoming obstacles.  3. Patient will identify feelings, thought process and behaviors related to these barriers. 4. Patient will identify two changes they are willing to make to overcome these obstacles:    Summary of Patient Progress: Pt came for last half of group, was quite talkative, and identified people in his family and conflict with them as the biggest obstacles he is dealing with.  He continues to blame his daughter and sisters and does not accept responsibility for any part of the current conflicts.        Therapeutic Modalities:   Cognitive Behavioral Therapy Solution Focused Therapy Motivational Interviewing Relapse Prevention Therapy  Daleen Squibb, LCSW

## 2018-04-08 NOTE — Plan of Care (Signed)
D: Patient presents somewhat agitated, labile. He is calmer than previous days, but continues to demand to be discharged. He slept well last night, and received medication that was helpful. His appetite is good, energy normal, and concentration good. He rates his depression 7/10, hopelessness 8/10 and anxiety 6/10. He complains of chronic back and hand pain (prior surgery), but denies withdrawal complaints. Patient denies SI/HI/AVH.  A: Patient checked q15 min, and checks reviewed. Reviewed medication changes with patient and educated on side effects. Educated patient on importance of attending group therapy sessions and educated on several coping skills. Encouarged participation in milieu through recreation therapy and attending meals with peers. Support and encouragement provided. Fluids offered. R: Patient receptive to education on medications, and is medication compliant. Patient contracts for safety on the unit. He did not set a goal today.  Problem: Education: Goal: Will be free of psychotic symptoms Outcome: Progressing Goal: Knowledge of the prescribed therapeutic regimen will improve Outcome: Progressing   Problem: Coping: Goal: Coping ability will improve Outcome: Progressing Goal: Will verbalize feelings Outcome: Progressing
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
## 2018-04-09 NOTE — Progress Notes (Signed)
Discharge note: Patient reviewed discharge paperwork with RN including prescriptions, follow up appointments, and lab work. Patient given the opportunity to ask questions. All concerns were addressed. All belongings were returned to patient. Denied SI/HI/AVH. Patient thanked staff for their care while at the hospital.  Patient was discharged to lobby with a bus pass in hand.

## 2018-04-09 NOTE — Progress Notes (Signed)
[facility] MD Progress Note  04/09/2018 9:42 AM ROSHON RAMALEY  MRN:  [id number] Subjective:   Patient was scheduled for discharge so we had done all those preparations and done the interview suicide risk assessment so forth but he could not get a ride so his discharge is held over till 2/26 Principal Problem: Bipolar disorder with mania and psychosis Diagnosis: Active Problems:   MDD (major depressive disorder), recurrent, severe, with psychosis (HCC)   Bipolar I disorder, current or most recent episode manic, with psychotic features (HCC)  Total Time spent with patient: 45 minutes  Past Medical History:  Past Medical History:  Diagnosis Date  . Chronic back pain   . Degenerated intervertebral disc   . Gangrene of digit 1975   right thumb gangrene  . Hypertension   . PTSD (post-traumatic stress disorder)     Past Surgical History:  Procedure Laterality Date  . ABSCESS DRAINAGE    . right thumb, finger     Family History: History reviewed. No pertinent family history.  Social History:  Social History   Substance and Sexual Activity  Alcohol Use No   Comment: occasionally     Social History   Substance and Sexual Activity  Drug Use Yes  . Types: Marijuana    Social History   Socioeconomic History  . Marital status: Married    Spouse name: Not on file  . Number of children: Not on file  . Years of education: Not on file  . Highest education level: Not on file  Occupational History  . Not on file  Social Needs  . Financial resource strain: Not on file  . Food insecurity:    Worry: Not on file    Inability: Not on file  . Transportation needs:    Medical: Not on file    Non-medical: Not on file  Tobacco Use  . Smoking status: Current Some Day Smoker    Packs/day: 0.50  . Smokeless tobacco: Never Used  Substance and Sexual Activity  . Alcohol use: No    Comment: occasionally  . Drug use: Yes    Types: Marijuana  . Sexual activity: Not Currently  Lifestyle   . Physical activity:    Days per week: Not on file    Minutes per session: Not on file  . Stress: Not on file  Relationships  . Social connections:    Talks on phone: Not on file    Gets together: Not on file    Attends religious service: Not on file    Active member of club or organization: Not on file    Attends meetings of clubs or organizations: Not on file    Relationship status: Not on file  Other Topics Concern  . Not on file  Social History Narrative  . Not on file   Additional Social History:                         Sleep: Good  Appetite:  Good  Current Medications: Current Facility-Administered Medications  Medication Dose Route Frequency Provider Last Rate Last Dose  . acetaminophen (TYLENOL) tablet 650 mg  650 mg Oral Q6H PRN Aldean Baker, NP   650 mg at 04/07/18 2041  . alum & mag hydroxide-simeth (MAALOX/MYLANTA) 200-200-20 MG/5ML suspension 30 mL  30 mL Oral Q4H PRN Aldean Baker, NP      . diphenhydrAMINE (BENADRYL) capsule 50 mg  50 mg Oral TID Malvin Johns, MD  50 mg at 04/09/18 2409  . haloperidol (HALDOL) tablet 10 mg  10 mg Oral TID Malvin Johns, MD   10 mg at 04/09/18 7353  . hydrOXYzine (ATARAX/VISTARIL) tablet 25 mg  25 mg Oral TID PRN Aldean Baker, NP      . ibuprofen (ADVIL,MOTRIN) tablet 600 mg  600 mg Oral Q8H PRN Cobos, Rockey Situ, MD   600 mg at 04/04/18 2300  . LORazepam (ATIVAN) tablet 2 mg  2 mg Oral Q6H PRN Malvin Johns, MD   2 mg at 04/07/18 2041   Or  . LORazepam (ATIVAN) injection 2 mg  2 mg Intramuscular Q6H PRN Malvin Johns, MD      . LORazepam (ATIVAN) injection 4 mg  4 mg Intramuscular Q6H PRN Malvin Johns, MD   4 mg at 04/06/18 0831  . magnesium hydroxide (MILK OF MAGNESIA) suspension 30 mL  30 mL Oral Daily PRN Aldean Baker, NP      . propranolol (INDERAL) tablet 20 mg  20 mg Oral BID Malvin Johns, MD   20 mg at 04/09/18 2992  . temazepam (RESTORIL) capsule 30 mg  30 mg Oral QHS Malvin Johns, MD   30 mg at 04/08/18  2041  . ziprasidone (GEODON) injection 20 mg  20 mg Intramuscular Q4H PRN Malvin Johns, MD   20 mg at 04/06/18 4268    Lab Results: No results found for this or any previous visit (from the past 48 hour(s)).  Blood Alcohol level:  Lab Results  Component Value Date   ETH <10 04/04/2018   ETH <11 04/02/2012    Metabolic Disorder Labs: No results found for: HGBA1C, MPG No results found for: PROLACTIN No results found for: CHOL, TRIG, HDL, CHOLHDL, VLDL, LDLCALC  Physical Findings: AIMS: Facial and Oral Movements Muscles of Facial Expression: None, normal Lips and Perioral Area: None, normal Jaw: None, normal Tongue: None, normal,Extremity Movements Upper (arms, wrists, hands, fingers): None, normal Lower (legs, knees, ankles, toes): None, normal, Trunk Movements Neck, shoulders, hips: None, normal, Overall Severity Severity of abnormal movements (highest score from questions above): None, normal Incapacitation due to abnormal movements: None, normal Patient's awareness of abnormal movements (rate only patient's report): No Awareness, Dental Status Current problems with teeth and/or dentures?: No Does patient usually wear dentures?: No  CIWA:  CIWA-Ar Total: 0 COWS:  COWS Total Score: 0  See discharge summary dictated on 2/24 for current mental status exam  Treatment Plan Summary: Plan Charge tomorrow  Malvin Johns, MD 04/09/2018, 9:42 AM

## 2018-04-09 NOTE — Progress Notes (Signed)
  [facility] Adult Case Management Discharge Plan :  Will you be returning to the same living situation after discharge:  No. Pt reports will be staying with brother in [place].  At discharge, do you have transportation home?: No. Bus pass provided.  Do you have the ability to pay for your medications: Yes,  BCBS  Release of information consent forms completed and in the chart;  Patient's signature needed at discharge.  Patient to Follow up at: Follow-up Information    Clinic, [facility] Go in 3 day(s).   Why:  Please attend a walk in appt at the eligibility office Mon-Fri, 8am-4pm.  Please request a transfer to [facility] from the [facility] office.  Please request primary care and mental health services. Contact information: [address] [facility] [place] [address] [phone]           Next level of care provider has access to [facility] Link:no  Safety Planning and Suicide Prevention discussed: No.Attempts made.   Have you used any form of tobacco in the last 30 days? (Cigarettes, Smokeless Tobacco, Cigars, and/or Pipes): Yes  Has patient been referred to the Quitline?: Patient refused referral  Patient has been referred for addiction treatment: Pt. refused referral  Lorri Frederick, LCSW 04/09/2018, 9:34 AM

## 2018-05-31 DIAGNOSIS — S39012A Strain of muscle, fascia and tendon of lower back, initial encounter: Secondary | ICD-10-CM | POA: Insufficient documentation

## 2018-05-31 DIAGNOSIS — Y939 Activity, unspecified: Secondary | ICD-10-CM | POA: Diagnosis not present

## 2018-05-31 DIAGNOSIS — S3992XA Unspecified injury of lower back, initial encounter: Secondary | ICD-10-CM | POA: Diagnosis present

## 2018-05-31 DIAGNOSIS — F17210 Nicotine dependence, cigarettes, uncomplicated: Secondary | ICD-10-CM | POA: Insufficient documentation

## 2018-05-31 DIAGNOSIS — Y92410 Unspecified street and highway as the place of occurrence of the external cause: Secondary | ICD-10-CM | POA: Diagnosis not present

## 2018-05-31 DIAGNOSIS — Z79899 Other long term (current) drug therapy: Secondary | ICD-10-CM | POA: Insufficient documentation

## 2018-05-31 DIAGNOSIS — I10 Essential (primary) hypertension: Secondary | ICD-10-CM | POA: Insufficient documentation

## 2018-05-31 DIAGNOSIS — Y999 Unspecified external cause status: Secondary | ICD-10-CM | POA: Diagnosis not present

## 2018-05-31 DIAGNOSIS — S161XXA Strain of muscle, fascia and tendon at neck level, initial encounter: Secondary | ICD-10-CM | POA: Insufficient documentation
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
## 2018-06-01 ENCOUNTER — Emergency Department (HOSPITAL_COMMUNITY): Payer: Medicare Other

## 2018-06-01 ENCOUNTER — Encounter (HOSPITAL_COMMUNITY): Payer: Self-pay | Admitting: *Deleted

## 2018-06-01 ENCOUNTER — Other Ambulatory Visit: Payer: Self-pay

## 2018-06-01 ENCOUNTER — Emergency Department (HOSPITAL_COMMUNITY)
Admission: EM | Admit: 2018-06-01 | Discharge: 2018-06-01 | Disposition: A | Discharge status: Home / Self Care | Payer: Medicare Other | Attending: Emergency Medicine | Admitting: Emergency Medicine

## 2018-06-01 DIAGNOSIS — S161XXA Strain of muscle, fascia and tendon at neck level, initial encounter: Secondary | ICD-10-CM

## 2018-06-01 DIAGNOSIS — S39012A Strain of muscle, fascia and tendon of lower back, initial encounter: Secondary | ICD-10-CM

## 2018-06-01 MED ORDER — CYCLOBENZAPRINE HCL 10 MG PO TABS: 10.0000 mg | ORAL_TABLET | Freq: Two times a day (BID) | ORAL | 0 refills | Status: AC | PRN

## 2018-06-01 MED ORDER — HYDROCODONE-ACETAMINOPHEN 5-325 MG PO TABS
2.0000 | ORAL_TABLET | Freq: Once | ORAL | Status: AC
  Administered 2018-06-01: 2 via ORAL
  Filled 2018-06-01: qty 2

## 2018-06-01 MED ORDER — IBUPROFEN 800 MG PO TABS: 800.0000 mg | ORAL_TABLET | Freq: Three times a day (TID) | ORAL | 0 refills | Status: AC

## 2018-06-01 NOTE — ED Notes (Signed)
Patient transported to X-ray 
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
## 2021-08-24 ENCOUNTER — Encounter (HOSPITAL_COMMUNITY): Payer: Self-pay

## 2021-08-24 ENCOUNTER — Ambulatory Visit (HOSPITAL_COMMUNITY)
Admission: EM | Admit: 2021-08-24 | Discharge: 2021-08-24 | Disposition: A | Discharge status: Home / Self Care | Payer: Non-veteran care | Attending: Physician Assistant | Admitting: Physician Assistant

## 2021-08-24 DIAGNOSIS — R519 Headache, unspecified: Secondary | ICD-10-CM

## 2021-08-24 DIAGNOSIS — L03211 Cellulitis of face: Secondary | ICD-10-CM

## 2021-08-24 MED ORDER — SULFAMETHOXAZOLE-TRIMETHOPRIM 800-160 MG PO TABS: 1.0000 | ORAL_TABLET | Freq: Two times a day (BID) | ORAL | 0 refills | Status: DC

## 2021-08-24 NOTE — Discharge Instructions (Addendum)
Advised warm compresses frequently to the area to help the cyst localized. Advised take the Bactrim DS 1 every 12 hours until completed. Advised to follow-up PCP or return to urgent care if the cyst area localizes but fails to drain, and then consider I&D.

## 2021-08-24 NOTE — ED Triage Notes (Signed)
Cyst on the left side chin, onset a few weeks. Patient states it is burning and growing.   States he has had cyst removed in the past under the arm and on the thigh below.

## 2021-08-24 NOTE — ED Provider Notes (Signed)
[facility]    CSN: [id number] Arrival date & time: 08/24/21  1027      History   Chief Complaint No chief complaint on file.   HPI Nathaniel Griffin is a 61 y.o. male.   61 year old male with a cyst on the right side of the face.  Patient indicates for the past couple weeks he has been having increasing cyst develop on the right lower side of the face.  Patient indicates that is mildly tender, and red.  Patient decays he takes Advil with some mild relief.  Patient relates no fever or chills.  Patient does have a history of having some cysts in the past that have had to be either removed or I&D performed.  Patient relates the area has not been draining yet.     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 Authorizing Provider  sulfamethoxazole-trimethoprim (BACTRIM DS) 800-160 MG tablet Take 1 tablet by mouth 2 (two) times daily for 7 days. 08/24/21 08/31/21 Yes Ellsworth Lennox, PA-C  cyclobenzaprine (FLEXERIL) 10 MG tablet Take 1 tablet (10 mg total) by mouth 2 (two) times daily as needed for muscle spasms. 06/01/18   Roxy Horseman, PA-C  haloperidol (HALDOL) 10 MG tablet Take 1 tablet (10 mg total) by mouth 2 (two) times daily. 04/08/18   Malvin Johns, MD  haloperidol decanoate (HALDOL  DECANOATE) 100 MG/ML injection Inject 1.5 mLs (150 mg total) into the muscle once for 1 dose. Due 3/24 04/08/18 04/08/18  Malvin Johns, MD  ibuprofen (ADVIL) 800 MG tablet Take 1 tablet (800 mg total) by mouth 3 (three) times daily. 06/01/18   Roxy Horseman, PA-C  propranolol (INDERAL) 40 MG tablet Take 1 tablet (40 mg total) by mouth 2 (two) times daily. 04/08/18   Malvin Johns, MD  temazepam (RESTORIL) 30 MG capsule Take 1 capsule (30 mg total) by mouth at bedtime. 04/08/18   Malvin Johns, MD    Family History History reviewed. No pertinent family history.  Social History Social History   Tobacco Use   Smoking status: Some Days    Packs/day: 0.50    Types: Cigarettes   Smokeless tobacco: Never  Substance Use Topics   Alcohol use: No    Comment: occasionally   Drug use: Yes    Types: Marijuana     Allergies   Trazodone   Review of Systems Review of Systems  Skin:  Positive for wound (right lower facial cyst).     Physical Exam Triage Vital Signs ED Triage Vitals  Enc Vitals Group     BP 08/24/21 1049 (!) 146/89     Pulse Rate 08/24/21 1049 77     Resp 08/24/21 1049 16     Temp 08/24/21 1049 (!)  97.4 F (36.3 C)     Temp Source 08/24/21 1049 Oral     SpO2 08/24/21 1049 90 %     Weight 08/24/21 1052 194 lb (88 kg)     Height 08/24/21 1052 6\' 2"  (1.88 m)     Head Circumference --      Peak Flow --      Pain Score 08/24/21 1052 8     Pain Loc --      Pain Edu? --      Excl. in GC? --    No data found.  Updated Vital Signs BP (!) 146/89 (BP Location: Left Arm)   Pulse 77   Temp (!) 97.4 F (36.3 C) (Oral)   Resp 16   Ht 6\' 2"  (1.88 m)   Wt 194 lb (88 kg)   SpO2 90%   BMI 24.91 kg/m   Visual Acuity Right Eye Distance:   Left Eye Distance:   Bilateral Distance:    Right Eye Near:   Left Eye Near:    Bilateral Near:     Physical Exam Constitutional:      Appearance: Normal appearance.  Skin:         Comments: Facial: There is a 1 cm hard  cystic formation on the lower right side of the face just below the corner of the mouth.  Mild redness present, no streaking, no drainage from the area.  This area is hard and tender to the touch, the cystic formation is too early for I&D at this time.  Neurological:     Mental Status: He is alert.      UC Treatments / Results  Labs (all labs ordered are listed, but only abnormal results are displayed) Labs Reviewed - No data to display  EKG   Radiology No results found.  Procedures Procedures (including critical care time)  Medications Ordered in UC Medications - No data to display  Initial Impression / Assessment and Plan / UC Course  I have reviewed the triage vital signs and the nursing notes.  Pertinent labs & imaging results that were available during my care of the patient were reviewed by me and considered in my medical decision making (see chart for details).    Plan: 1.  Advised to take Tylenol or ibuprofen as needed for pain relief. 2.  Advised to apply warm compresses frequently to the area to help with the cystic formation localized and soften. 3.  Advised take Bactrim DS 1 every 12 hours to help cyst localized and soften. 4.  Advised to follow-up with PCP or return to urgent care if symptoms fail to improve.  (Consider I&D if cystic formation is ready) Final Clinical Impressions(s) / UC Diagnoses   Final diagnoses:  Facial pain  Cellulitis of face     Discharge Instructions      Advised warm compresses frequently to the area to help the cyst localized. Advised take the Bactrim DS 1 every 12 hours until completed. Advised to follow-up PCP or return to urgent care if the cyst area localizes but fails to drain, and then consider I&D.    ED Prescriptions     Medication Sig Dispense Auth. Provider   sulfamethoxazole-trimethoprim (BACTRIM DS) 800-160 MG tablet Take 1 tablet by mouth 2 (two) times daily for 7 days. 14 tablet 10/25/21, PA-C       PDMP not reviewed this encounter.   , PA-C 08/24/21 1142

## 2021-08-31 ENCOUNTER — Encounter (HOSPITAL_COMMUNITY): Payer: Self-pay | Admitting: Emergency Medicine

## 2021-08-31 ENCOUNTER — Ambulatory Visit (HOSPITAL_COMMUNITY)
Admission: EM | Admit: 2021-08-31 | Discharge: 2021-08-31 | Disposition: A | Discharge status: Home / Self Care | Payer: Non-veteran care | Attending: Physician Assistant | Admitting: Physician Assistant

## 2021-08-31 DIAGNOSIS — L729 Follicular cyst of the skin and subcutaneous tissue, unspecified: Secondary | ICD-10-CM

## 2021-08-31 DIAGNOSIS — L089 Local infection of the skin and subcutaneous tissue, unspecified: Secondary | ICD-10-CM | POA: Diagnosis not present

## 2021-08-31 MED ORDER — LIDOCAINE-EPINEPHRINE 1 %-1:100000 IJ SOLN
INTRAMUSCULAR | Status: AC
  Filled 2021-08-31: qty 1

## 2021-08-31 MED ORDER — CEPHALEXIN 500 MG PO CAPS: 500.0000 mg | ORAL_CAPSULE | Freq: Three times a day (TID) | ORAL | 0 refills | Status: AC

## 2021-08-31 NOTE — ED Provider Notes (Signed)
[facility]    CSN: [id number] Arrival date & time: 08/31/21  0844      History   Chief Complaint Chief Complaint  Patient presents with   Abscess    HPI Nathaniel Griffin is a 61 y.o. male.   Patient presents today with a month-long history of cyst on the right side of his jaw.  He was seen by our clinic on 08/24/2021 at which point he was started on Bactrim but I&D was not indicated.  He reports over the past several days he has been able to express purulent drainage and is requesting this be drained in clinic today.  Reports pain is rated 4 on a 0-10 pain scale, described as aching, worse with palpation, no alleviating factors identified.  He denies history of recurrent skin infections or MRSA.  Denies history of diabetes or immunosuppression.  Denies additional antibiotic use outside of Bactrim DS that was prescribed at last visit.  Reports that area has become less painful and less swollen but continues to be bothersome prompting evaluation today.  Denies any fever, nausea, vomiting, swelling of his throat, difficulty swallowing.    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 Authorizing Provider  cephALEXin (KEFLEX) 500 MG capsule Take 1 capsule (500 mg total) by mouth 3 (three) times  daily. 08/31/21  Yes Nichlos Kunzler K, PA-C  cyclobenzaprine (FLEXERIL) 10 MG tablet Take 1 tablet (10 mg total) by mouth 2 (two) times daily as needed for muscle spasms. 06/01/18   Roxy Horseman, PA-C  haloperidol (HALDOL) 10 MG tablet Take 1 tablet (10 mg total) by mouth 2 (two) times daily. 04/08/18   Malvin Johns, MD  haloperidol decanoate (HALDOL DECANOATE) 100 MG/ML injection Inject 1.5 mLs (150 mg total) into the muscle once for 1 dose. Due 3/24 04/08/18 04/08/18  Malvin Johns, MD  ibuprofen (ADVIL) 800 MG tablet Take 1 tablet (800 mg total) by mouth 3 (three) times daily. 06/01/18   Roxy Horseman, PA-C  propranolol (INDERAL) 40 MG tablet Take 1 tablet (40 mg total) by mouth 2 (two) times daily. 04/08/18   Malvin Johns, MD  temazepam (RESTORIL) 30 MG capsule Take 1 capsule (30 mg total) by mouth at bedtime. 04/08/18   Malvin Johns, MD    Family History History reviewed. No pertinent family history.  Social History Social History   Tobacco Use   Smoking status: Some Days    Packs/day: 0.50    Types: Cigarettes   Smokeless tobacco: Never  Substance Use Topics   Alcohol use: No    Comment: occasionally   Drug use: Yes    Types: Marijuana     Allergies   Trazodone   Review of Systems Review of Systems  Constitutional:  Positive for activity change. Negative for appetite change, fatigue and fever.  HENT:  Negative for trouble swallowing and voice change.   Gastrointestinal:  Negative for abdominal pain, diarrhea, nausea and vomiting.  Skin:  Positive for color change and wound.     Physical Exam Triage Vital Signs ED Triage Vitals  Enc Vitals Group     BP 08/31/21 0922 107/72     Pulse Rate 08/31/21 0922 68     Resp 08/31/21 0922 18     Temp 08/31/21 0922 98.2 F (36.8 C)     Temp Source 08/31/21 0922 Oral     SpO2 08/31/21 0922 95 %     Weight 08/31/21 0923 195 lb (88.5 kg)     Height 08/31/21 0923 6\' 2"  (1.88 m)     Head Circumference --      Peak Flow --       Pain Score 08/31/21 0923 3     Pain Loc --      Pain Edu? --      Excl. in GC? --    No data found.  Updated Vital Signs BP 107/72 (BP Location: Right Arm)   Pulse 68   Temp 98.2 F (36.8 C) (Oral)   Resp 18   Ht 6\' 2"  (1.88 m)   Wt 195 lb (88.5 kg)   SpO2 95%   BMI 25.04 kg/m   Visual Acuity Right Eye Distance:   Left Eye Distance:   Bilateral Distance:    Right Eye Near:   Left Eye Near:    Bilateral Near:     Physical Exam Vitals reviewed.  Constitutional:      General: He is awake.     Appearance: Normal appearance. He is well-developed. He is not ill-appearing.     Comments: Very pleasant male appears stated age in no acute distress sitting comfortably in exam room  HENT:     Head: Normocephalic and atraumatic.      Comments: 3 cm x 1 cm cyst with central fluctuance. Cardiovascular:     Rate and Rhythm: Normal rate and regular rhythm.     Heart sounds: Normal heart sounds, S1 normal and S2 normal. No murmur heard. Pulmonary:     Effort: Pulmonary effort is normal.     Breath sounds: Normal breath sounds. No stridor. No wheezing, rhonchi or rales.     Comments: Clear to auscultation bilaterally Abdominal:     General: Bowel sounds are normal.     Palpations: Abdomen is soft.     Tenderness: There is no abdominal tenderness.  Neurological:     Mental Status: He is alert.  Psychiatric:        Behavior: Behavior is cooperative.      UC Treatments / Results  Labs (all labs ordered are listed, but only abnormal results are displayed) Labs Reviewed - No data to display  EKG   Radiology No results found.  Procedures Incision and Drainage  Date/Time: 08/31/2021 10:22 AM  Performed by: , PA-C Authorized by: 09/02/2021, PA-C   Consent:    Consent obtained:  Verbal   Consent given by:  Patient   Risks discussed:  Bleeding, incomplete drainage and infection   Alternatives discussed:  Referral Universal protocol:    Procedure  explained and questions answered to patient or proxy's satisfaction: yes     Patient identity confirmed:  Verbally with patient Location:    Type:  Cyst   Size:  3 cm  x 1 cm   Location:  Head   Head location:  Face Pre-procedure details:    Skin preparation:  Chlorhexidine with alcohol Sedation:    Sedation type:  None Anesthesia:    Anesthesia method:  Local infiltration   Local anesthetic:  Lidocaine 1% w/o epi Procedure type:    Complexity:  Simple Procedure details:    Ultrasound guidance: no     Needle aspiration: no     Incision types:  Stab incision   Incision depth:  Dermal   Wound management:  Probed and deloculated and irrigated with saline   Drainage:  Bloody and purulent   Drainage amount:  Scant   Wound treatment:  Wound left open   Packing materials:  None Post-procedure details:    Procedure completion:  Tolerated well, no immediate complications  (including critical care time)  Medications Ordered in UC Medications - No data to display  Initial Impression / Assessment and Plan / UC Course  I have reviewed the triage vital signs and the nursing notes.  Pertinent labs & imaging results that were available during my care of the patient were reviewed by me and considered in my medical decision making (see chart for details).     I&D performed in clinic able to express some additional purulent drainage but without resolution of cyst.  Discussed that ultimately this may need to be removed and recommend he follow-up with plastic surgeon.  He was given contact information for local provider with instruction to call to schedule an appointment.  Encouraged him to use warm compresses to encourage drainage.  We will start Keflex for additional coverage.  Discussed that if he develops any enlarging lesion, change in drainage, fever, nausea, vomiting, swelling of his throat, difficulty swallowing he needs to be seen in the emergency room immediately.  Strict return  precautions given.  Work excuse note provided.  Final Clinical Impressions(s) / UC Diagnoses   Final diagnoses:  Infected cyst of skin     Discharge Instructions      We drained this lesion.  I do think ultimately he will need to have the cyst removed.  Please call plastic surgeon to schedule an appointment.  If you have any worsening symptoms including enlarging lesion, fever, nausea, vomiting, swelling of your throat, difficulty swallowing you need to go to the emergency room.  Start Keflex.  Use warm compresses.  You can alternate Tylenol or Profen for pain.     ED Prescriptions     Medication Sig Dispense Auth. Provider   cephALEXin (KEFLEX) 500 MG capsule Take 1 capsule (500 mg total) by mouth 3 (three) times daily. 21 capsule Zayven Powe K, PA-C      PDMP not reviewed this encounter.   Jeani Hawking, PA-C 08/31/21 1024

## 2021-08-31 NOTE — Discharge Instructions (Signed)
We drained this lesion.  I do think ultimately he will need to have the cyst removed.  Please call plastic surgeon to schedule an appointment.  If you have any worsening symptoms including enlarging lesion, fever, nausea, vomiting, swelling of your throat, difficulty swallowing you need to go to the emergency room.  Start Keflex.  Use warm compresses.  You can alternate Tylenol or Profen for pain.

## 2021-08-31 NOTE — ED Triage Notes (Signed)
Patient c/o cyst on right side of face x 1 month.  Patient was able to drain some pus from the area but it's hard now.

## 2021-10-14 ENCOUNTER — Institutional Professional Consult (permissible substitution): Payer: Medicare Other | Admitting: Plastic Surgery
